# Patient Record
Sex: Female | Born: 1935 | Race: White | Hispanic: No | Marital: Married
Health system: Southern US, Community
[De-identification: ages and names within clinical notes are randomized; demographics above are authoritative.]

---

## 2005-12-01 ENCOUNTER — Ambulatory Visit: Payer: Self-pay | Admitting: Ophthalmology

## 2007-11-10 ENCOUNTER — Ambulatory Visit: Payer: Self-pay | Admitting: Family Medicine

## 2008-02-26 ENCOUNTER — Emergency Department: Payer: Self-pay | Admitting: Emergency Medicine

## 2008-03-02 ENCOUNTER — Emergency Department: Payer: Self-pay | Admitting: Emergency Medicine

## 2008-12-11 ENCOUNTER — Ambulatory Visit: Payer: Self-pay | Admitting: Family Medicine

## 2010-01-22 ENCOUNTER — Ambulatory Visit: Payer: Self-pay | Admitting: Internal Medicine

## 2011-06-23 ENCOUNTER — Ambulatory Visit: Payer: Self-pay | Admitting: Internal Medicine

## 2012-06-24 ENCOUNTER — Inpatient Hospital Stay: Payer: Self-pay | Admitting: Orthopedic Surgery

## 2012-06-24 LAB — URINALYSIS, COMPLETE
Bilirubin,UR: NEGATIVE
Glucose,UR: NEGATIVE mg/dL (ref 0–75)
Nitrite: NEGATIVE
Ph: 7 (ref 4.5–8.0)
Protein: NEGATIVE
RBC,UR: 1 /HPF (ref 0–5)

## 2012-06-24 LAB — CBC
HCT: 37 % (ref 35.0–47.0)
Platelet: 181 10*3/uL (ref 150–440)
RBC: 4.06 10*6/uL (ref 3.80–5.20)
RDW: 15.1 % — ABNORMAL HIGH (ref 11.5–14.5)
WBC: 8 10*3/uL (ref 3.6–11.0)

## 2012-06-24 LAB — BASIC METABOLIC PANEL
Anion Gap: 3 — ABNORMAL LOW (ref 7–16)
Calcium, Total: 8.3 mg/dL — ABNORMAL LOW (ref 8.5–10.1)
EGFR (African American): >60
Glucose: 168 mg/dL — ABNORMAL HIGH (ref 65–99)
Potassium: 3.7 mmol/L (ref 3.5–5.1)

## 2012-06-25 LAB — CBC WITH DIFFERENTIAL/PLATELET
Basophil #: 0 10*3/uL (ref 0.0–0.1)
HCT: 37.4 % (ref 35.0–47.0)
HGB: 12.2 g/dL (ref 12.0–16.0)
Lymphocyte #: 0.8 10*3/uL — ABNORMAL LOW (ref 1.0–3.6)
Lymphocyte %: 7.9 %
MCHC: 32.6 g/dL (ref 32.0–36.0)
MCV: 91 fL (ref 80–100)
Monocyte %: 10 %
Neutrophil %: 81.5 %
RDW: 15 % — ABNORMAL HIGH (ref 11.5–14.5)
WBC: 9.8 10*3/uL (ref 3.6–11.0)

## 2012-06-25 LAB — PROTIME-INR
INR: 1
Prothrombin Time: 13.1 secs (ref 11.5–14.7)

## 2012-06-25 LAB — BASIC METABOLIC PANEL
BUN: 16 mg/dL (ref 7–18)
Calcium, Total: 8.2 mg/dL — ABNORMAL LOW (ref 8.5–10.1)
Chloride: 104 mmol/L (ref 98–107)
Co2: 31 mmol/L (ref 21–32)
EGFR (African American): >60
EGFR (Non-African Amer.): >60
Glucose: 118 mg/dL — ABNORMAL HIGH (ref 65–99)
Osmolality: 278 (ref 275–301)
Potassium: 4.4 mmol/L (ref 3.5–5.1)

## 2012-06-26 LAB — CBC WITH DIFFERENTIAL/PLATELET
Eosinophil %: 0.1 %
HCT: 23.6 % — ABNORMAL LOW (ref 35.0–47.0)
HGB: 7.8 g/dL — ABNORMAL LOW (ref 12.0–16.0)
MCH: 30 pg (ref 26.0–34.0)
MCHC: 32.9 g/dL (ref 32.0–36.0)
MCV: 91 fL (ref 80–100)
Monocyte #: 1.5 x10 3/mm — ABNORMAL HIGH (ref 0.2–0.9)
Monocyte %: 16.5 %
Neutrophil #: 6.6 10*3/uL — ABNORMAL HIGH (ref 1.4–6.5)
Neutrophil %: 75.1 %
RBC: 2.59 10*6/uL — ABNORMAL LOW (ref 3.80–5.20)
RDW: 15.2 % — ABNORMAL HIGH (ref 11.5–14.5)
WBC: 8.8 10*3/uL (ref 3.6–11.0)

## 2012-06-26 LAB — PROTIME-INR: INR: 1.1

## 2012-06-28 ENCOUNTER — Encounter: Payer: Self-pay | Admitting: Internal Medicine

## 2012-06-28 LAB — CBC WITH DIFFERENTIAL/PLATELET
Basophil %: 0.3 %
Eosinophil #: 0.1 10*3/uL (ref 0.0–0.7)
Eosinophil %: 1.2 %
HCT: 23 % — ABNORMAL LOW (ref 35.0–47.0)
Lymphocyte #: 0.9 10*3/uL — ABNORMAL LOW (ref 1.0–3.6)
Lymphocyte %: 9.4 %
MCHC: 34.1 g/dL (ref 32.0–36.0)
Monocyte #: 1.7 x10 3/mm — ABNORMAL HIGH (ref 0.2–0.9)
Monocyte %: 16.5 %
Neutrophil %: 72.6 %
Platelet: 145 10*3/uL — ABNORMAL LOW (ref 150–440)
RDW: 15 % — ABNORMAL HIGH (ref 11.5–14.5)
WBC: 10 10*3/uL (ref 3.6–11.0)

## 2012-06-28 LAB — BASIC METABOLIC PANEL
Calcium, Total: 8.2 mg/dL — ABNORMAL LOW (ref 8.5–10.1)
Co2: 29 mmol/L (ref 21–32)
EGFR (Non-African Amer.): >60
Glucose: 97 mg/dL (ref 65–99)
Potassium: 3.9 mmol/L (ref 3.5–5.1)

## 2012-06-29 LAB — PATHOLOGY REPORT

## 2012-06-30 ENCOUNTER — Encounter: Payer: Self-pay | Admitting: Internal Medicine

## 2012-07-19 LAB — BASIC METABOLIC PANEL
BUN: 16 mg/dL (ref 7–18)
Calcium, Total: 8.5 mg/dL (ref 8.5–10.1)
Co2: 30 mmol/L (ref 21–32)
EGFR (African American): >60
EGFR (Non-African Amer.): >60
Glucose: 79 mg/dL (ref 65–99)
Osmolality: 283 (ref 275–301)
Potassium: 3.9 mmol/L (ref 3.5–5.1)
Sodium: 142 mmol/L (ref 136–145)

## 2012-07-19 LAB — T4, FREE: Free Thyroxine: 1.14 ng/dL (ref 0.76–1.46)

## 2012-07-19 LAB — TSH: Thyroid Stimulating Horm: 1.11 u[IU]/mL

## 2012-07-20 ENCOUNTER — Inpatient Hospital Stay: Payer: Self-pay | Admitting: Orthopedic Surgery

## 2012-07-20 LAB — CBC
MCHC: 33 g/dL (ref 32.0–36.0)
Platelet: 383 10*3/uL (ref 150–440)
RBC: 3.39 10*6/uL — ABNORMAL LOW (ref 3.80–5.20)
WBC: 6.7 10*3/uL (ref 3.6–11.0)

## 2012-07-20 LAB — TROPONIN I: Troponin-I: <0.02 ng/mL

## 2012-07-20 LAB — COMPREHENSIVE METABOLIC PANEL
Albumin: 2.9 g/dL — ABNORMAL LOW (ref 3.4–5.0)
BUN: 18 mg/dL (ref 7–18)
Bilirubin,Total: 0.4 mg/dL (ref 0.2–1.0)
Creatinine: 0.78 mg/dL (ref 0.60–1.30)
EGFR (African American): >60
Osmolality: 279 (ref 275–301)
Potassium: 3.6 mmol/L (ref 3.5–5.1)
SGOT(AST): 29 U/L (ref 15–37)
SGPT (ALT): 32 U/L (ref 12–78)
Total Protein: 7 g/dL (ref 6.4–8.2)

## 2012-07-20 LAB — URINALYSIS, COMPLETE
Bacteria: NONE SEEN
Blood: NEGATIVE
Glucose,UR: NEGATIVE mg/dL (ref 0–75)
Leukocyte Esterase: NEGATIVE
Ph: 6 (ref 4.5–8.0)
Protein: NEGATIVE
WBC UR: <1 /HPF (ref 0–5)

## 2012-07-20 LAB — PROTIME-INR
INR: 1
Prothrombin Time: 13.7 secs (ref 11.5–14.7)

## 2012-07-20 LAB — APTT: Activated PTT: 25.2 secs (ref 23.6–35.9)

## 2012-07-21 LAB — BASIC METABOLIC PANEL
BUN: 15 mg/dL (ref 7–18)
Calcium, Total: 8.3 mg/dL — ABNORMAL LOW (ref 8.5–10.1)
EGFR (African American): >60
Glucose: 101 mg/dL — ABNORMAL HIGH (ref 65–99)
Osmolality: 279 (ref 275–301)
Potassium: 4 mmol/L (ref 3.5–5.1)
Sodium: 139 mmol/L (ref 136–145)

## 2012-07-21 LAB — CBC WITH DIFFERENTIAL/PLATELET
Basophil %: 0.8 %
HCT: 25.5 % — ABNORMAL LOW (ref 35.0–47.0)
HGB: 8.4 g/dL — ABNORMAL LOW (ref 12.0–16.0)
Lymphocyte %: 12.6 %
MCHC: 33 g/dL (ref 32.0–36.0)
MCV: 92 fL (ref 80–100)
Monocyte #: 1.2 x10 3/mm — ABNORMAL HIGH (ref 0.2–0.9)
Monocyte %: 16.2 %
Neutrophil %: 68.8 %
RBC: 2.79 10*6/uL — ABNORMAL LOW (ref 3.80–5.20)
RDW: 16 % — ABNORMAL HIGH (ref 11.5–14.5)

## 2012-07-22 LAB — CBC WITH DIFFERENTIAL/PLATELET
Basophil #: 0 10*3/uL (ref 0.0–0.1)
Eosinophil %: 0.3 %
HCT: 23.7 % — ABNORMAL LOW (ref 35.0–47.0)
HGB: 8 g/dL — ABNORMAL LOW (ref 12.0–16.0)
Lymphocyte %: 11.9 %
MCH: 29.7 pg (ref 26.0–34.0)
Monocyte %: 22 %
Neutrophil #: 5 10*3/uL (ref 1.4–6.5)
Neutrophil %: 65.4 %
Platelet: 191 10*3/uL (ref 150–440)
RBC: 2.68 10*6/uL — ABNORMAL LOW (ref 3.80–5.20)

## 2012-07-22 LAB — BASIC METABOLIC PANEL
BUN: 16 mg/dL (ref 7–18)
Chloride: 107 mmol/L (ref 98–107)
Co2: 29 mmol/L (ref 21–32)
Creatinine: 0.57 mg/dL — ABNORMAL LOW (ref 0.60–1.30)
EGFR (African American): >60
Glucose: 112 mg/dL — ABNORMAL HIGH (ref 65–99)
Osmolality: 279 (ref 275–301)
Potassium: 3.9 mmol/L (ref 3.5–5.1)
Sodium: 139 mmol/L (ref 136–145)

## 2012-07-22 LAB — HEMOGLOBIN: HGB: 7.7 g/dL — ABNORMAL LOW (ref 12.0–16.0)

## 2012-07-23 LAB — BASIC METABOLIC PANEL
Anion Gap: 4 — ABNORMAL LOW (ref 7–16)
Calcium, Total: 7.7 mg/dL — ABNORMAL LOW (ref 8.5–10.1)
Chloride: 105 mmol/L (ref 98–107)
Co2: 29 mmol/L (ref 21–32)
EGFR (African American): >60
Sodium: 138 mmol/L (ref 136–145)

## 2012-07-24 LAB — CBC WITH DIFFERENTIAL/PLATELET
Basophil #: 0 10*3/uL (ref 0.0–0.1)
Basophil %: 0.2 %
HCT: 20.4 % — ABNORMAL LOW (ref 35.0–47.0)
Lymphocyte #: 0.9 10*3/uL — ABNORMAL LOW (ref 1.0–3.6)
Lymphocyte %: 7.3 %
MCH: 29.6 pg (ref 26.0–34.0)
MCHC: 33.3 g/dL (ref 32.0–36.0)
MCV: 89 fL (ref 80–100)
Monocyte #: 1.9 x10 3/mm — ABNORMAL HIGH (ref 0.2–0.9)
RBC: 2.29 10*6/uL — ABNORMAL LOW (ref 3.80–5.20)
RDW: 15.2 % — ABNORMAL HIGH (ref 11.5–14.5)
WBC: 11.6 10*3/uL — ABNORMAL HIGH (ref 3.6–11.0)

## 2012-07-24 LAB — HEMOGLOBIN: HGB: 9.4 g/dL — ABNORMAL LOW (ref 12.0–16.0)

## 2012-07-24 LAB — BASIC METABOLIC PANEL
Anion Gap: 7 (ref 7–16)
Calcium, Total: 7.7 mg/dL — ABNORMAL LOW (ref 8.5–10.1)
Creatinine: 0.63 mg/dL (ref 0.60–1.30)
EGFR (African American): >60
EGFR (Non-African Amer.): >60
Glucose: 106 mg/dL — ABNORMAL HIGH (ref 65–99)
Potassium: 3.6 mmol/L (ref 3.5–5.1)
Sodium: 138 mmol/L (ref 136–145)

## 2012-07-25 LAB — CBC WITH DIFFERENTIAL/PLATELET
Basophil #: 0.1 10*3/uL (ref 0.0–0.1)
Basophil %: 0.7 %
Eosinophil %: 1.9 %
HCT: 27.5 % — ABNORMAL LOW (ref 35.0–47.0)
HGB: 9.3 g/dL — ABNORMAL LOW (ref 12.0–16.0)
Lymphocyte #: 1 10*3/uL (ref 1.0–3.6)
Lymphocyte %: 7.7 %
MCHC: 33.9 g/dL (ref 32.0–36.0)
MCV: 88 fL (ref 80–100)
Monocyte #: 1.7 x10 3/mm — ABNORMAL HIGH (ref 0.2–0.9)
RBC: 3.12 10*6/uL — ABNORMAL LOW (ref 3.80–5.20)
RDW: 14.8 % — ABNORMAL HIGH (ref 11.5–14.5)

## 2012-07-25 LAB — BASIC METABOLIC PANEL
BUN: 13 mg/dL (ref 7–18)
Chloride: 104 mmol/L (ref 98–107)
Co2: 28 mmol/L (ref 21–32)
Creatinine: 0.51 mg/dL — ABNORMAL LOW (ref 0.60–1.30)
EGFR (African American): >60
EGFR (Non-African Amer.): >60
Glucose: 99 mg/dL (ref 65–99)
Osmolality: 276 (ref 275–301)
Potassium: 3.7 mmol/L (ref 3.5–5.1)

## 2012-07-26 LAB — CBC WITH DIFFERENTIAL/PLATELET
Basophil %: 0.2 %
Eosinophil #: 0.3 10*3/uL (ref 0.0–0.7)
Eosinophil %: 2.2 %
MCH: 29.7 pg (ref 26.0–34.0)
MCV: 88 fL (ref 80–100)
Platelet: 232 10*3/uL (ref 150–440)
RBC: 3.12 10*6/uL — ABNORMAL LOW (ref 3.80–5.20)
RDW: 14.6 % — ABNORMAL HIGH (ref 11.5–14.5)
WBC: 11.6 10*3/uL — ABNORMAL HIGH (ref 3.6–11.0)

## 2012-07-31 ENCOUNTER — Encounter: Payer: Self-pay | Admitting: Internal Medicine

## 2012-07-31 ENCOUNTER — Ambulatory Visit: Payer: Self-pay | Admitting: Nurse Practitioner

## 2012-08-01 LAB — BASIC METABOLIC PANEL
Anion Gap: 6 — ABNORMAL LOW (ref 7–16)
BUN: 12 mg/dL (ref 7–18)
Chloride: 103 mmol/L (ref 98–107)
Co2: 30 mmol/L (ref 21–32)
Creatinine: 0.68 mg/dL (ref 0.60–1.30)
EGFR (Non-African Amer.): >60
Glucose: 103 mg/dL — ABNORMAL HIGH (ref 65–99)
Osmolality: 278 (ref 275–301)
Potassium: 4.2 mmol/L (ref 3.5–5.1)
Sodium: 139 mmol/L (ref 136–145)

## 2012-08-01 LAB — CBC WITH DIFFERENTIAL/PLATELET
Bands: 3 %
HCT: 31.5 % — ABNORMAL LOW (ref 35.0–47.0)
HGB: 10.2 g/dL — ABNORMAL LOW (ref 12.0–16.0)
Lymphocytes: 5 %
MCH: 28.7 pg (ref 26.0–34.0)
MCHC: 32.4 g/dL (ref 32.0–36.0)
MCV: 89 fL (ref 80–100)
Monocytes: 6 %
Myelocyte: 1 %
Platelet: 564 10*3/uL — ABNORMAL HIGH (ref 150–440)
RDW: 15 % — ABNORMAL HIGH (ref 11.5–14.5)
Segmented Neutrophils: 80 %

## 2012-08-02 LAB — URINALYSIS, COMPLETE
Bilirubin,UR: NEGATIVE
Glucose,UR: NEGATIVE mg/dL (ref 0–75)
Nitrite: NEGATIVE
Protein: NEGATIVE
Specific Gravity: 1.021 (ref 1.003–1.030)
WBC UR: 6 /HPF (ref 0–5)

## 2012-08-16 LAB — COMPREHENSIVE METABOLIC PANEL
Alkaline Phosphatase: 95 U/L (ref 50–136)
Anion Gap: 6 — ABNORMAL LOW (ref 7–16)
BUN: 13 mg/dL (ref 7–18)
Bilirubin,Total: 0.3 mg/dL (ref 0.2–1.0)
Calcium, Total: 8.5 mg/dL (ref 8.5–10.1)
Chloride: 101 mmol/L (ref 98–107)
Co2: 30 mmol/L (ref 21–32)
Creatinine: 0.56 mg/dL — ABNORMAL LOW (ref 0.60–1.30)
EGFR (African American): >60
Glucose: 92 mg/dL (ref 65–99)
Osmolality: 274 (ref 275–301)
Potassium: 4.1 mmol/L (ref 3.5–5.1)
SGOT(AST): 24 U/L (ref 15–37)
SGPT (ALT): 20 U/L (ref 12–78)
Sodium: 137 mmol/L (ref 136–145)
Total Protein: 6.4 g/dL (ref 6.4–8.2)

## 2012-08-16 LAB — CBC WITH DIFFERENTIAL/PLATELET
Basophil #: 0.1 10*3/uL (ref 0.0–0.1)
Basophil %: 0.8 %
Eosinophil %: 3.8 %
HGB: 9.4 g/dL — ABNORMAL LOW (ref 12.0–16.0)
Lymphocyte #: 1.3 10*3/uL (ref 1.0–3.6)
Lymphocyte %: 19 %
MCH: 28.2 pg (ref 26.0–34.0)
Monocyte #: 0.9 x10 3/mm (ref 0.2–0.9)
Monocyte %: 13.4 %
Neutrophil %: 63 %
RBC: 3.34 10*6/uL — ABNORMAL LOW (ref 3.80–5.20)

## 2012-08-16 LAB — PROTIME-INR: Prothrombin Time: 14.8 secs — ABNORMAL HIGH (ref 11.5–14.7)

## 2012-08-20 LAB — WOUND CULTURE

## 2012-08-22 LAB — VANCOMYCIN, TROUGH: Vancomycin, Trough: 6 ug/mL — ABNORMAL LOW (ref 10–20)

## 2012-08-22 LAB — CREATININE, SERUM: EGFR (African American): 57 — ABNORMAL LOW

## 2012-08-23 LAB — CBC WITH DIFFERENTIAL/PLATELET
Basophil %: 0.9 %
Eosinophil #: 0.4 10*3/uL (ref 0.0–0.7)
HCT: 28.1 % — ABNORMAL LOW (ref 35.0–47.0)
Lymphocyte #: 1.3 10*3/uL (ref 1.0–3.6)
MCHC: 33.4 g/dL (ref 32.0–36.0)
Monocyte #: 1.1 x10 3/mm — ABNORMAL HIGH (ref 0.2–0.9)
Neutrophil %: 62.4 %
Platelet: 353 10*3/uL (ref 150–440)
RBC: 3.32 10*6/uL — ABNORMAL LOW (ref 3.80–5.20)
WBC: 7.7 10*3/uL (ref 3.6–11.0)

## 2012-08-23 LAB — COMPREHENSIVE METABOLIC PANEL
Albumin: 2.1 g/dL — ABNORMAL LOW (ref 3.4–5.0)
Alkaline Phosphatase: 100 U/L (ref 50–136)
Anion Gap: 5 — ABNORMAL LOW (ref 7–16)
BUN: 15 mg/dL (ref 7–18)
Bilirubin,Total: 0.3 mg/dL (ref 0.2–1.0)
Chloride: 102 mmol/L (ref 98–107)
Co2: 32 mmol/L (ref 21–32)
EGFR (Non-African Amer.): >60
Glucose: 80 mg/dL (ref 65–99)
Osmolality: 277 (ref 275–301)
Potassium: 3.7 mmol/L (ref 3.5–5.1)
SGOT(AST): 23 U/L (ref 15–37)

## 2012-08-23 LAB — SEDIMENTATION RATE: Erythrocyte Sed Rate: 54 mm/hr — ABNORMAL HIGH (ref 0–30)

## 2012-08-24 ENCOUNTER — Ambulatory Visit: Payer: Self-pay | Admitting: Gerontology

## 2012-08-27 LAB — VANCOMYCIN, TROUGH: Vancomycin, Trough: 21 ug/mL (ref 10–20)

## 2012-08-30 ENCOUNTER — Encounter: Payer: Self-pay | Admitting: Internal Medicine

## 2012-08-31 ENCOUNTER — Emergency Department: Payer: Self-pay | Admitting: Emergency Medicine

## 2012-09-30 DEATH — deceased

## 2013-12-14 IMAGING — CT CT CERVICAL SPINE WITHOUT CONTRAST
1 series · 12 of 14 positions shown, 15 images · non-contrast
Comparison: None

REASON FOR EXAM: FALL, DEMENTIA
COMMENTS:

PROCEDURE:     CT  - CT CERVICAL SPINE WO  - June 24, 2012  [DATE]
RESULT:     Clinical Indication: Trauma
TECHNIQUE: Multiple axial CT images from the skull base to the mid vertebral
body of T1. obtained with sagittal and coronal reformatted images provided.

[Series 5: axial · axial · 0.31mm/px · z∈[+192,+355]mm · 12 of 101 slices shown, 15 images]
[im 8/101  soft-tissue]
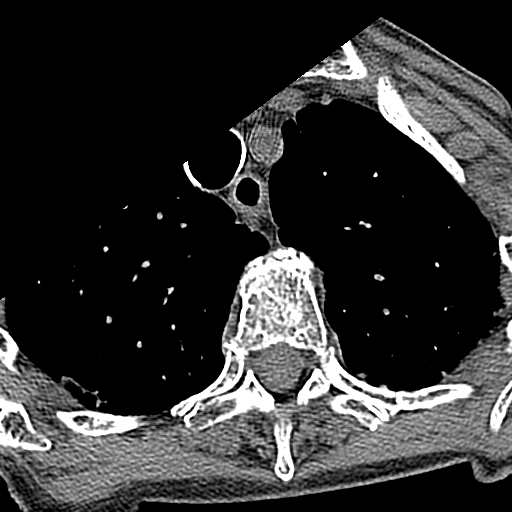
[im 8/101  bone]
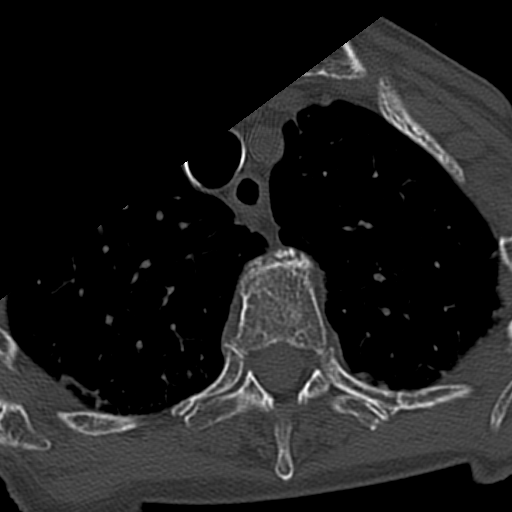
[im 16/101  bone]
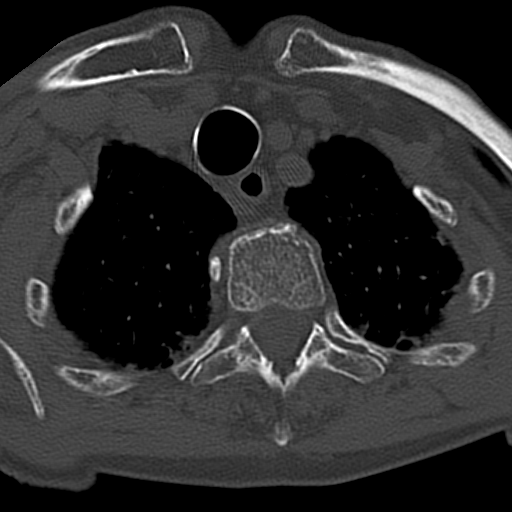
[im 24/101  bone]
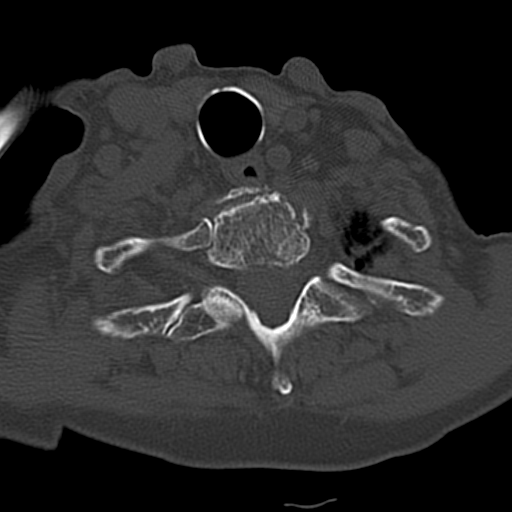
[im 31/101  bone]
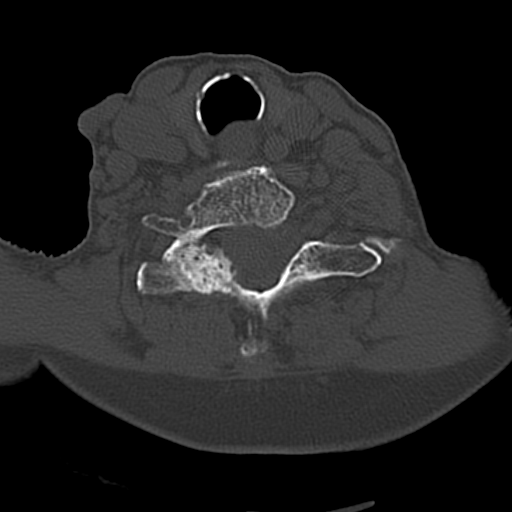
[im 39/101  soft-tissue]
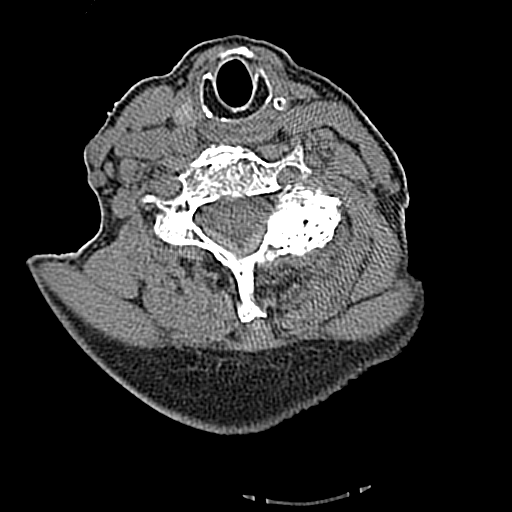
[im 39/101  bone]
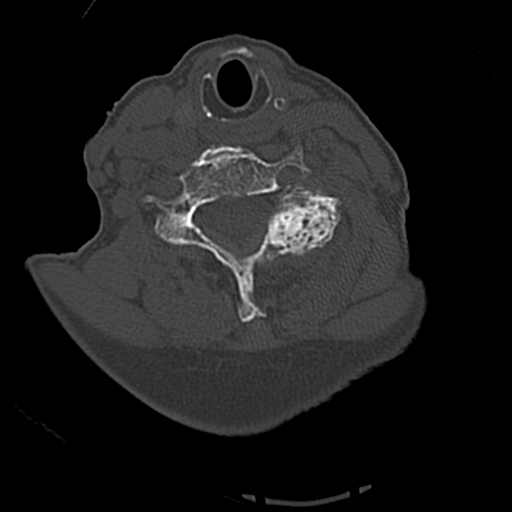
[im 47/101  bone]
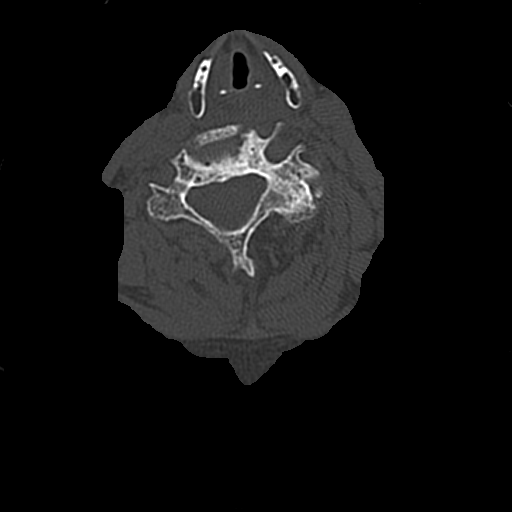
[im 54/101  bone]
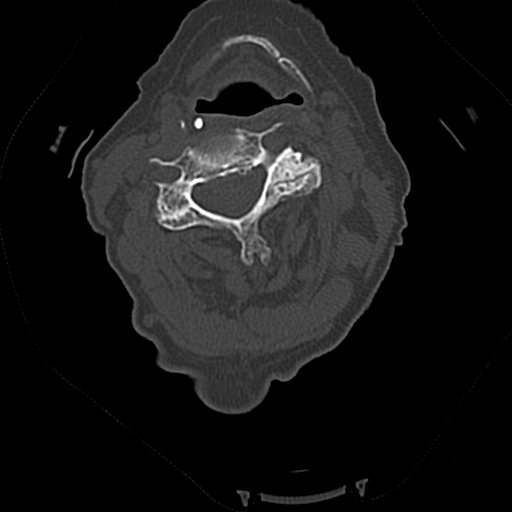
[im 62/101  bone]
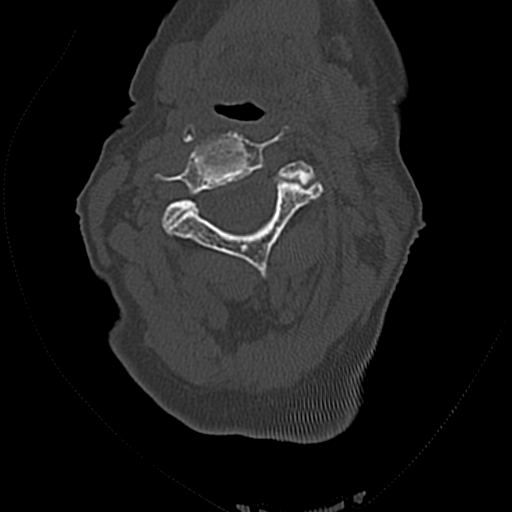
[im 70/101  soft-tissue]
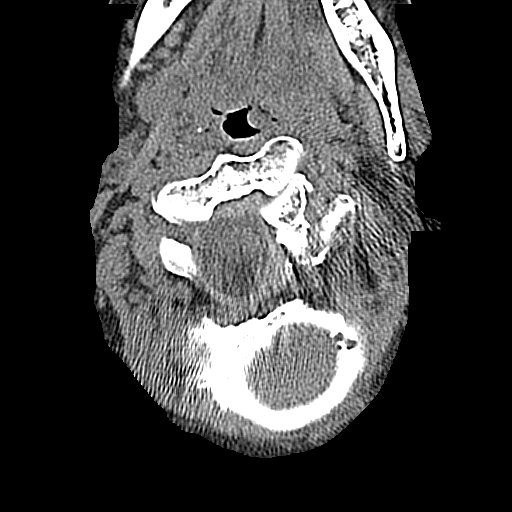
[im 70/101  bone]
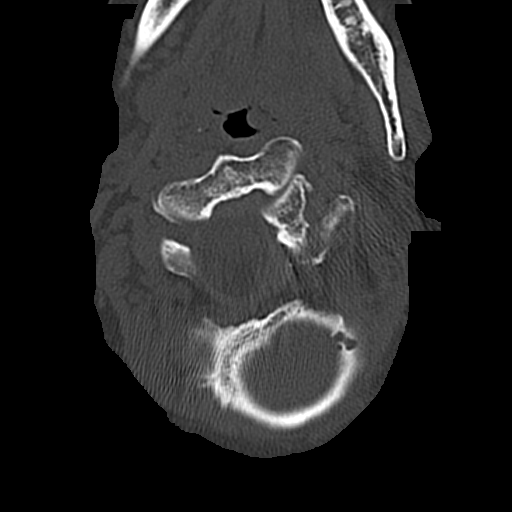
[im 77/101  bone]
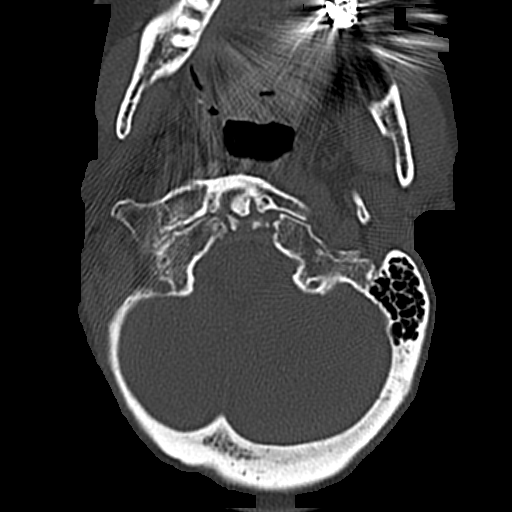
[im 85/101  bone]
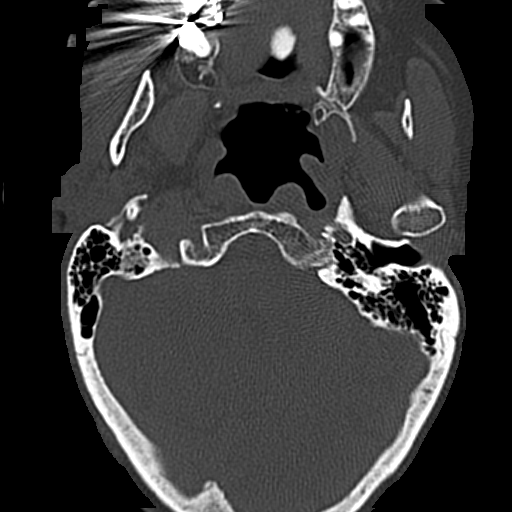
[im 93/101  bone]
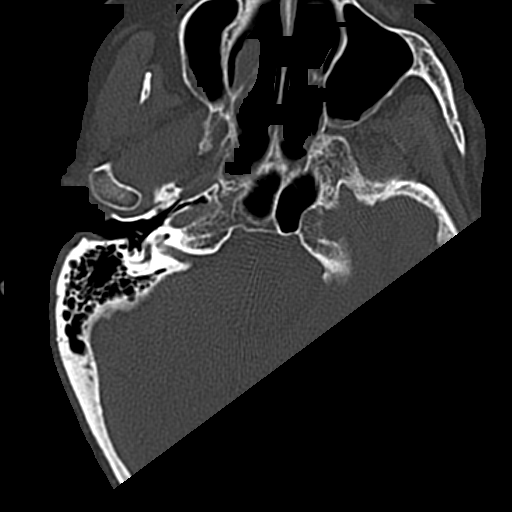

[12 of 14 positions shown; findings below may reference images not displayed]

FINDINGS: The alignment is anatomic. The vertebral body heights are maintained. There
is no acute fracture or static listhesis. The prevertebral soft tissues are
normal. The intraspinal soft tissues are not fully imaged on this
examination due to poor soft tissue contrast, but there is no soft tissue
gross abnormality.

There is degenerative disc disease throughout the cervical spine most severe
at C3-C4, C4-C5 and C5-C6 with discogenic endplate osteophytes. There is
severe facet arthropathy throughout the cervical spine.

The visualized portions of the lung apices demonstrate no focal abnormality.
IMPRESSION: 1. No acute osseous injury of the cervical spine.

2. Ligamentous injury is not evaluated. If there is high clinical concern
for ligamentous injury, consider MRI or flexion/extension radiographs as
clinically indicated and tolerated.

[REDACTED]

## 2014-01-10 IMAGING — XA DG C-ARM 1-60 MIN
1 series · 5 of 5 positions shown · non-contrast
Comparison: none

REASON FOR EXAM: Fracture reduction/fixation
COMMENTS:

PROCEDURE:     DXR - DXR C-ARM WITH 2 VIEWS RT HIP  - July 21, 2012  [DATE]
RESULT:     Comparison: 07/20/2012

[Series 6001: (person_name) · 5 of 5 slices shown]
[im 1/5]
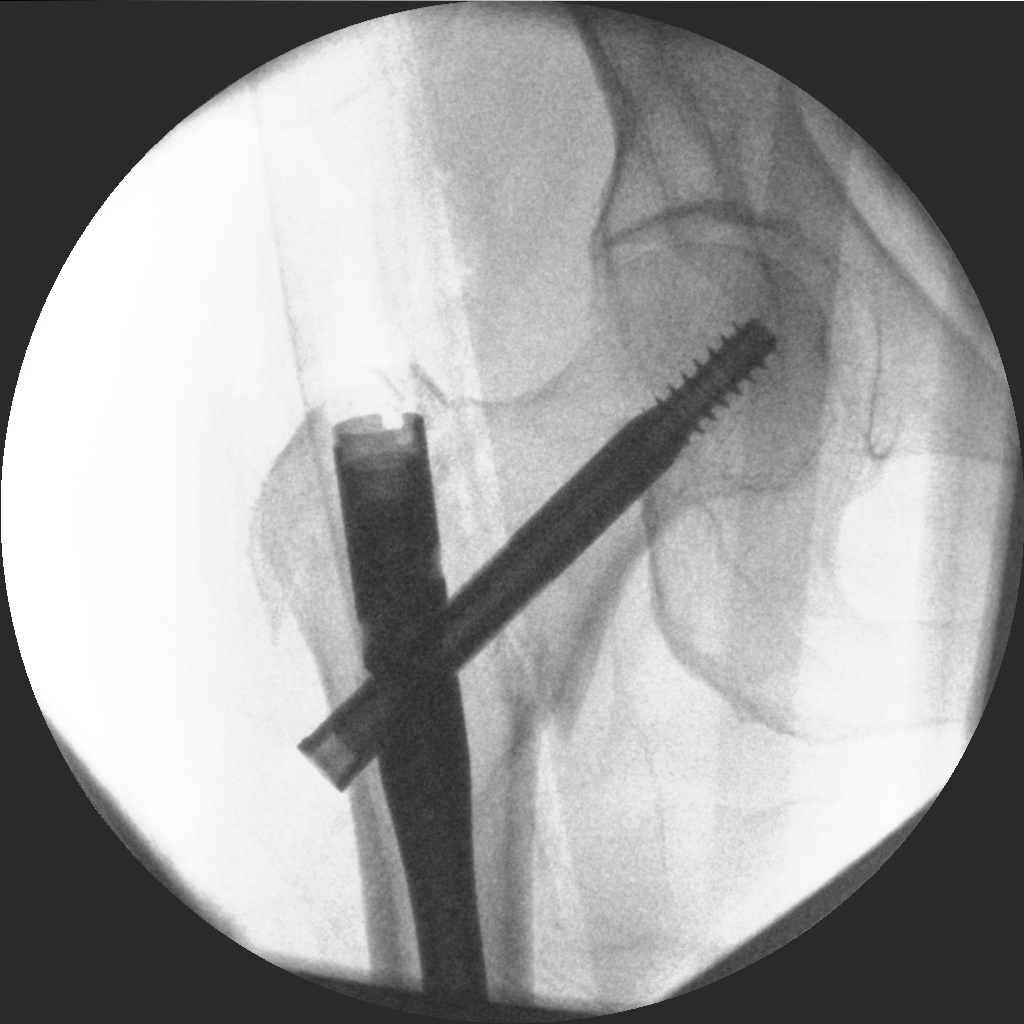
[im 2/5]
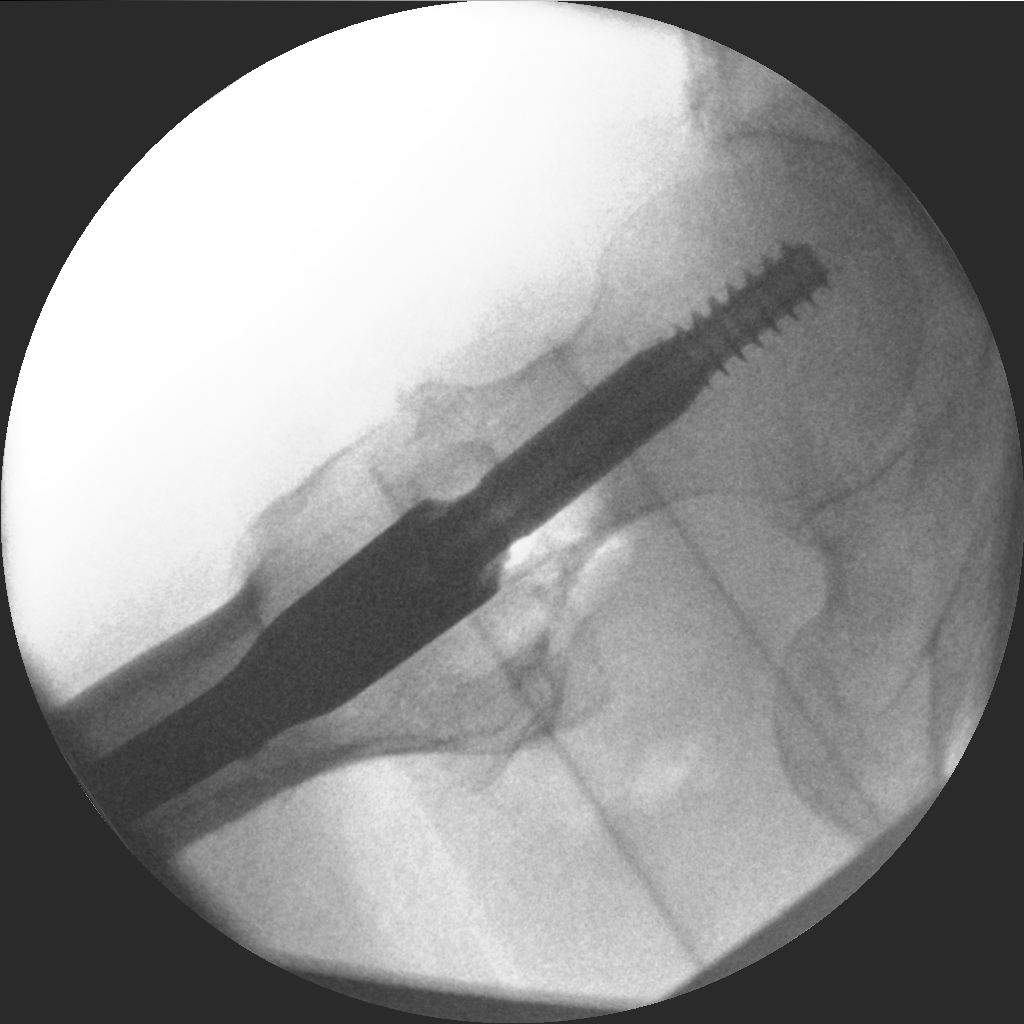
[im 3/5]
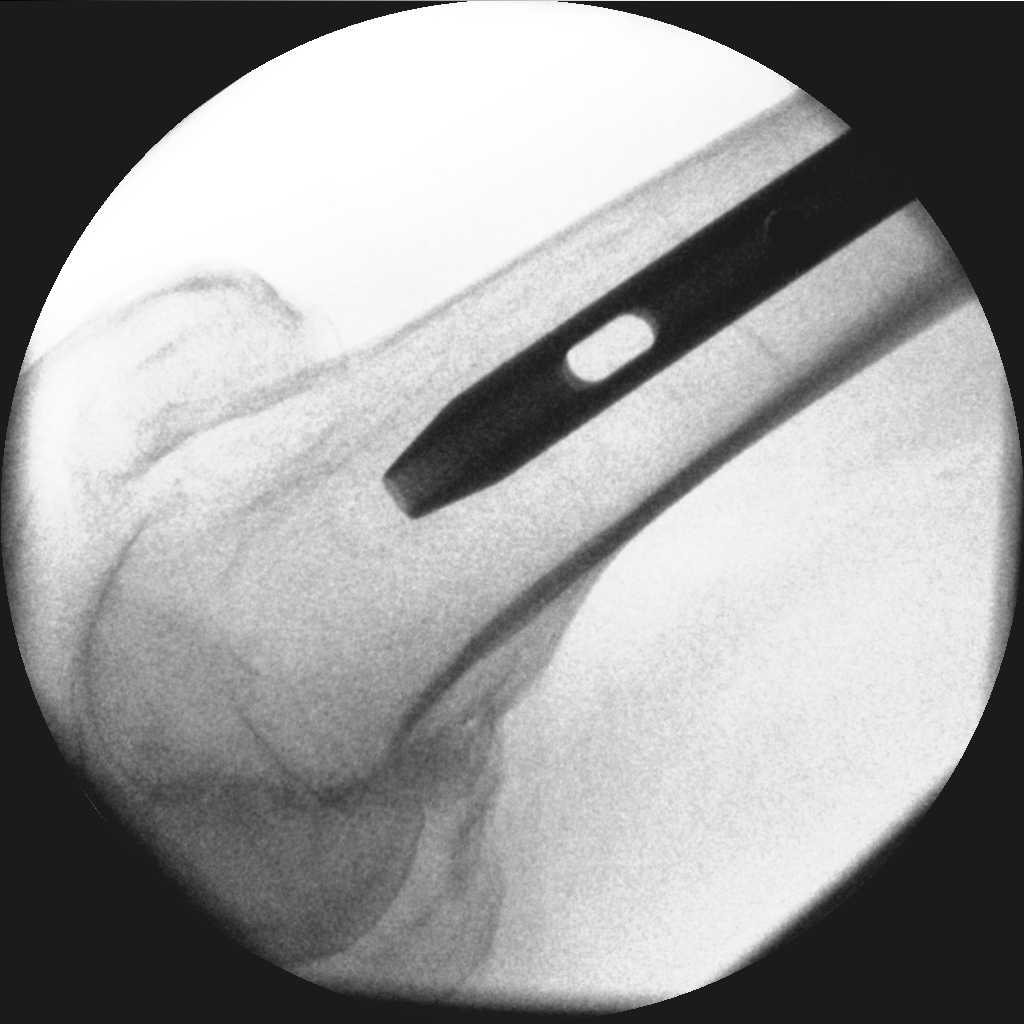
[im 4/5]
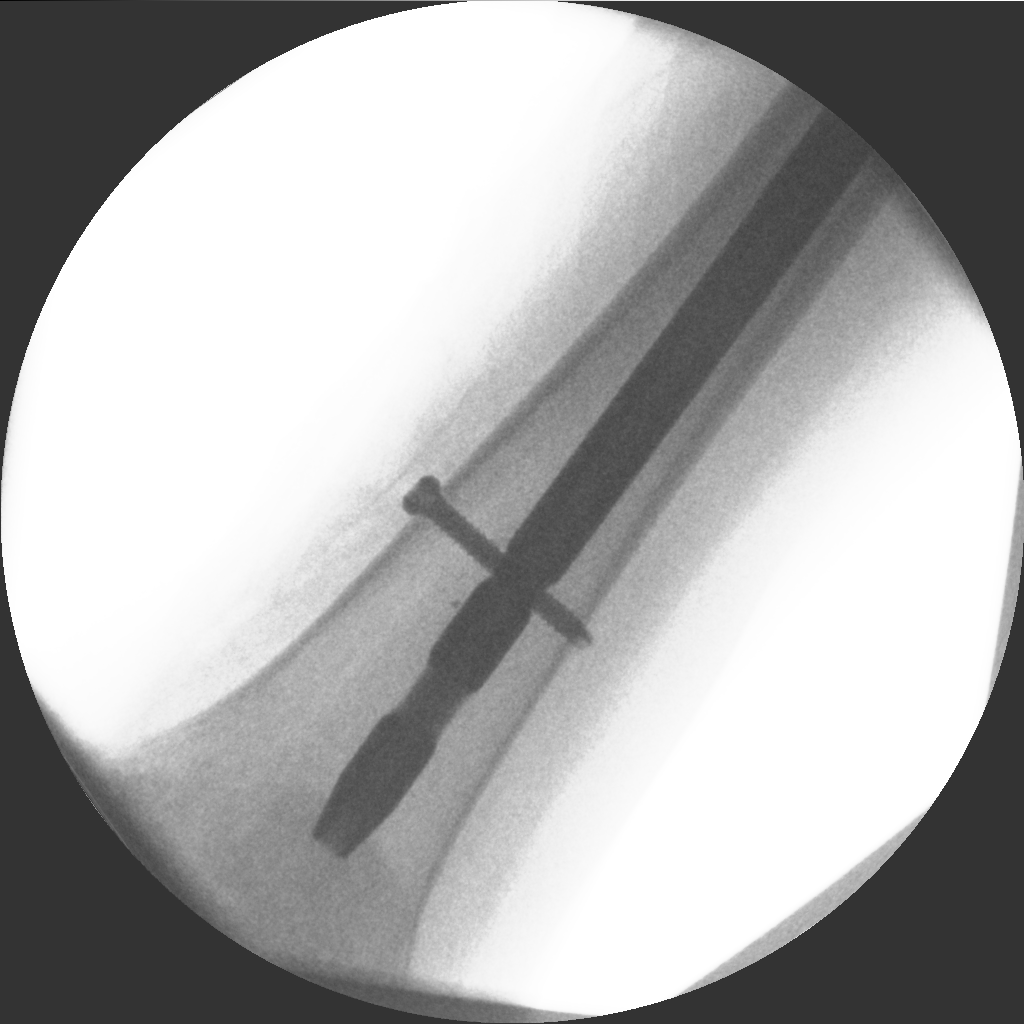
[im 5/5]
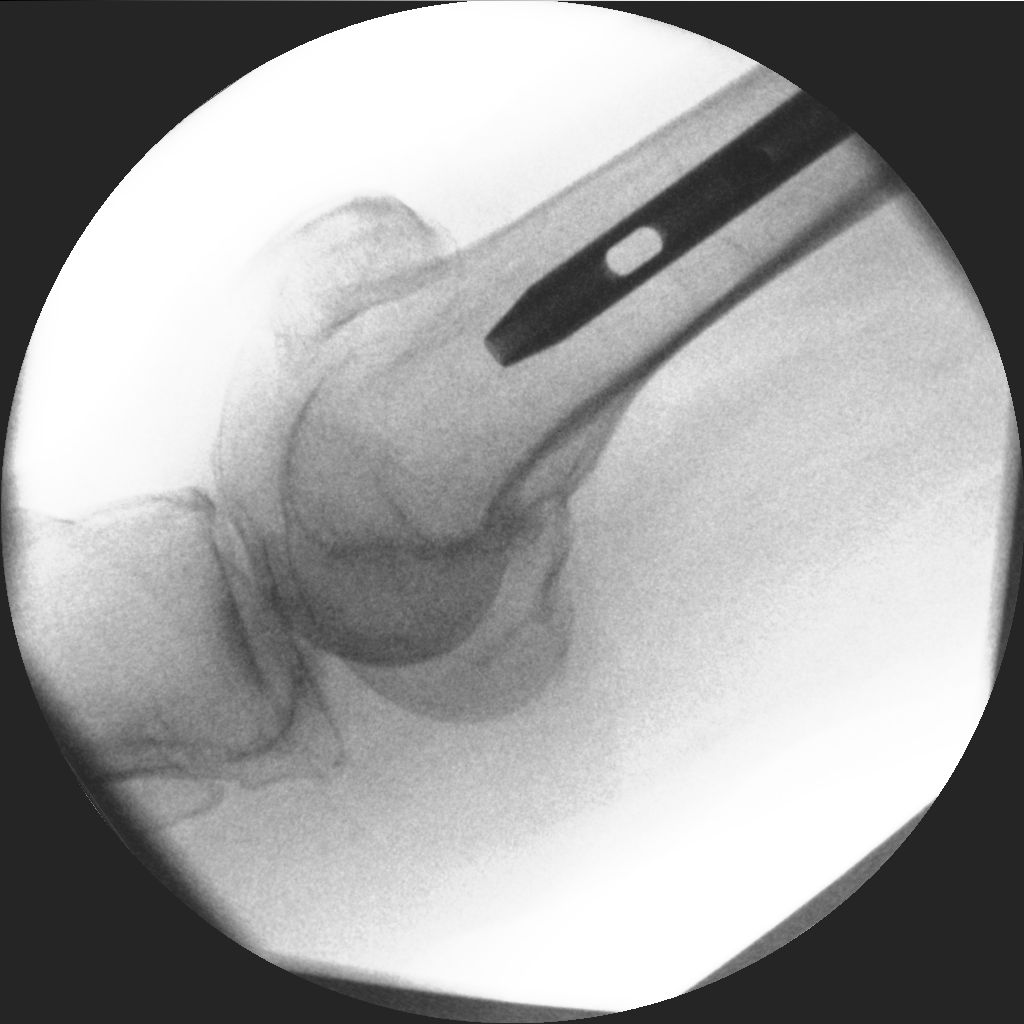

[5 of 5 positions shown; findings below may reference images not displayed]

FINDINGS: Five fluoroscopic images were submitted from intraoperative procedure.
Intramedullary rod and interlocking screws are seen traversing the femur.
Intertrochanteric fracture is again demonstrated. The remainder of the
interpretation is deferred to the performing clinician.
IMPRESSION: Please see above.

[REDACTED]

## 2014-02-20 IMAGING — CR DG CHEST 1V PORT
1 series · 1 of 1 positions shown · non-contrast
Comparison: none

REASON FOR EXAM: post PICC line replacement
COMMENTS:

[ap]
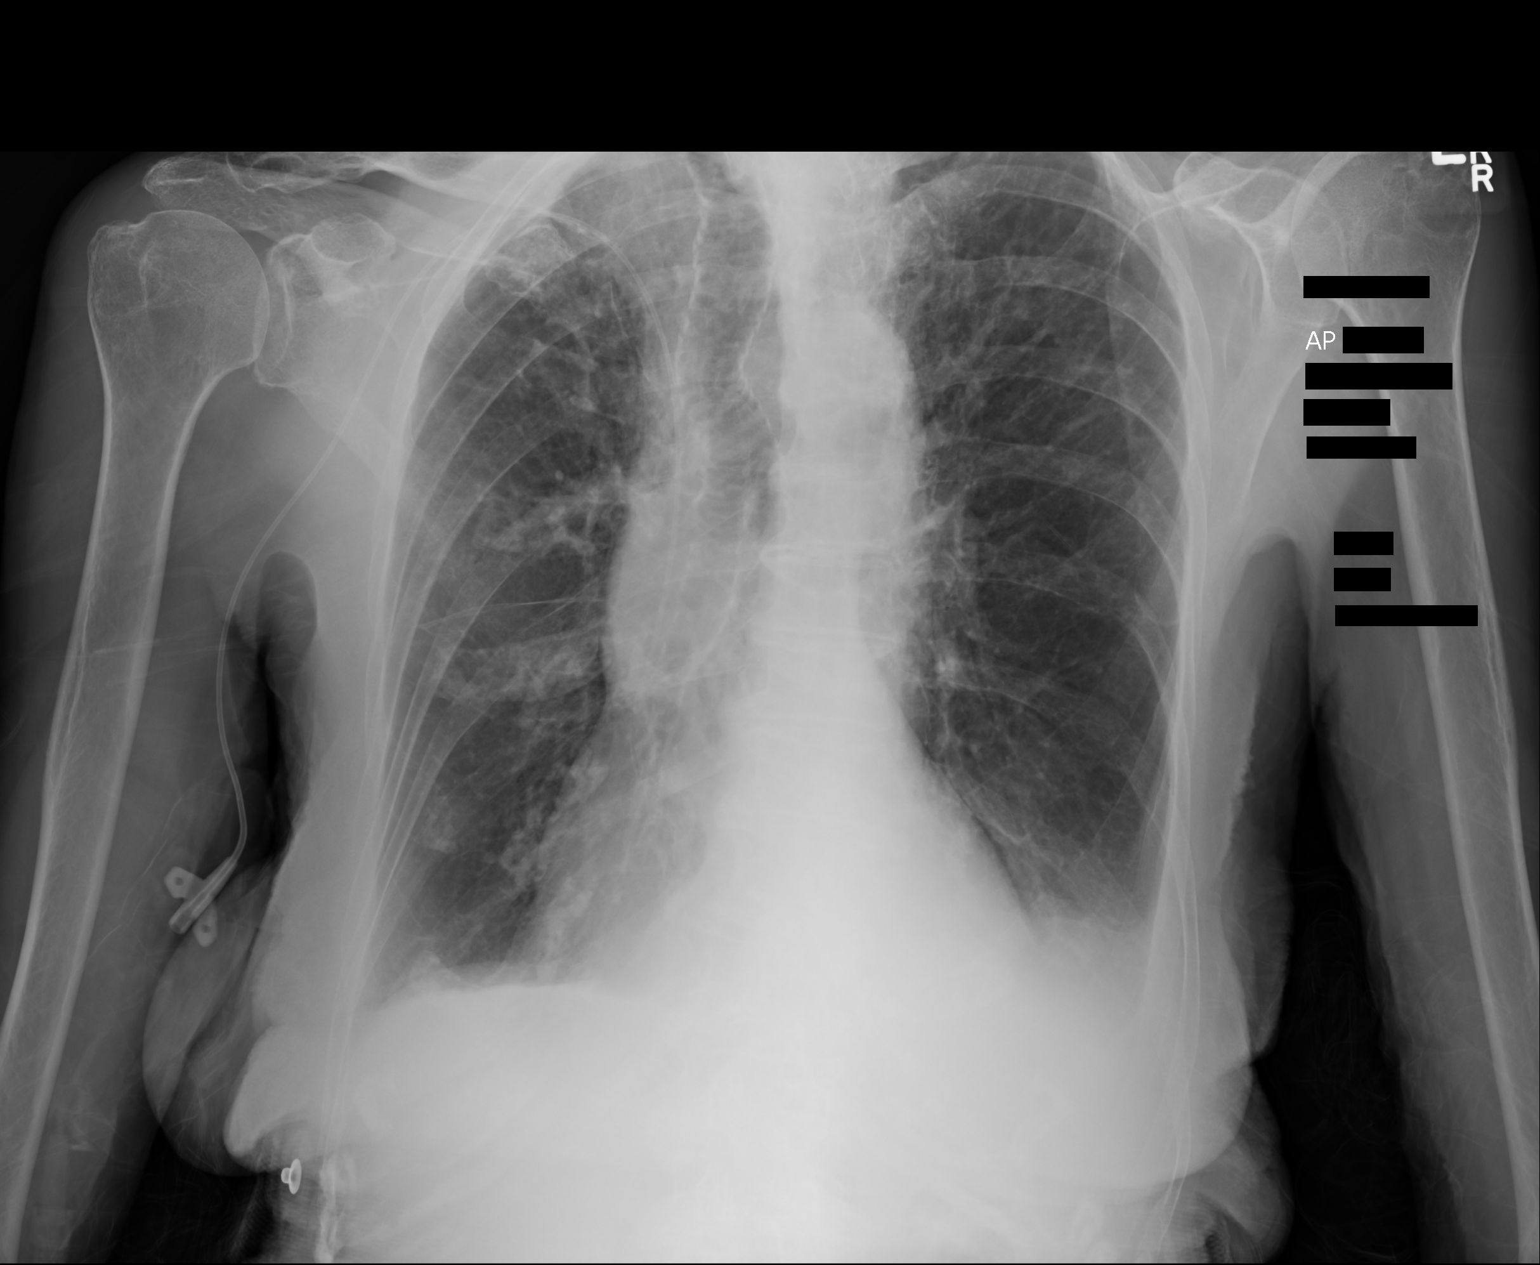

[1 of 1 positions shown; findings below may reference images not displayed]

PROCEDURE:     DXR - DXR PORTABLE CHEST SINGLE VIEW  - August 31, 2012 [DATE]

RESULT:     Comparison made to prior study 08/31/2012. Central line noted in
good anatomic position. Right hilar fullness is present. Mass lesion cannot
be excluded. There is atelectasis in the right upper lobe. Small left
pleural effusion is present.

Cardiovascular structures are unremarkable.
IMPRESSION: Possible right hilar mass with atelectasis right upper
lobe. Chest CTs can be obtained for further evaluation. Small left pleural
effusion . PICC line in good position.

## 2014-02-20 IMAGING — CR DG CHEST 1V PORT
1 series · 1 of 1 positions shown · non-contrast
Comparison: none

REASON FOR EXAM: PICC line possibly broken off
COMMENTS:

[ap]
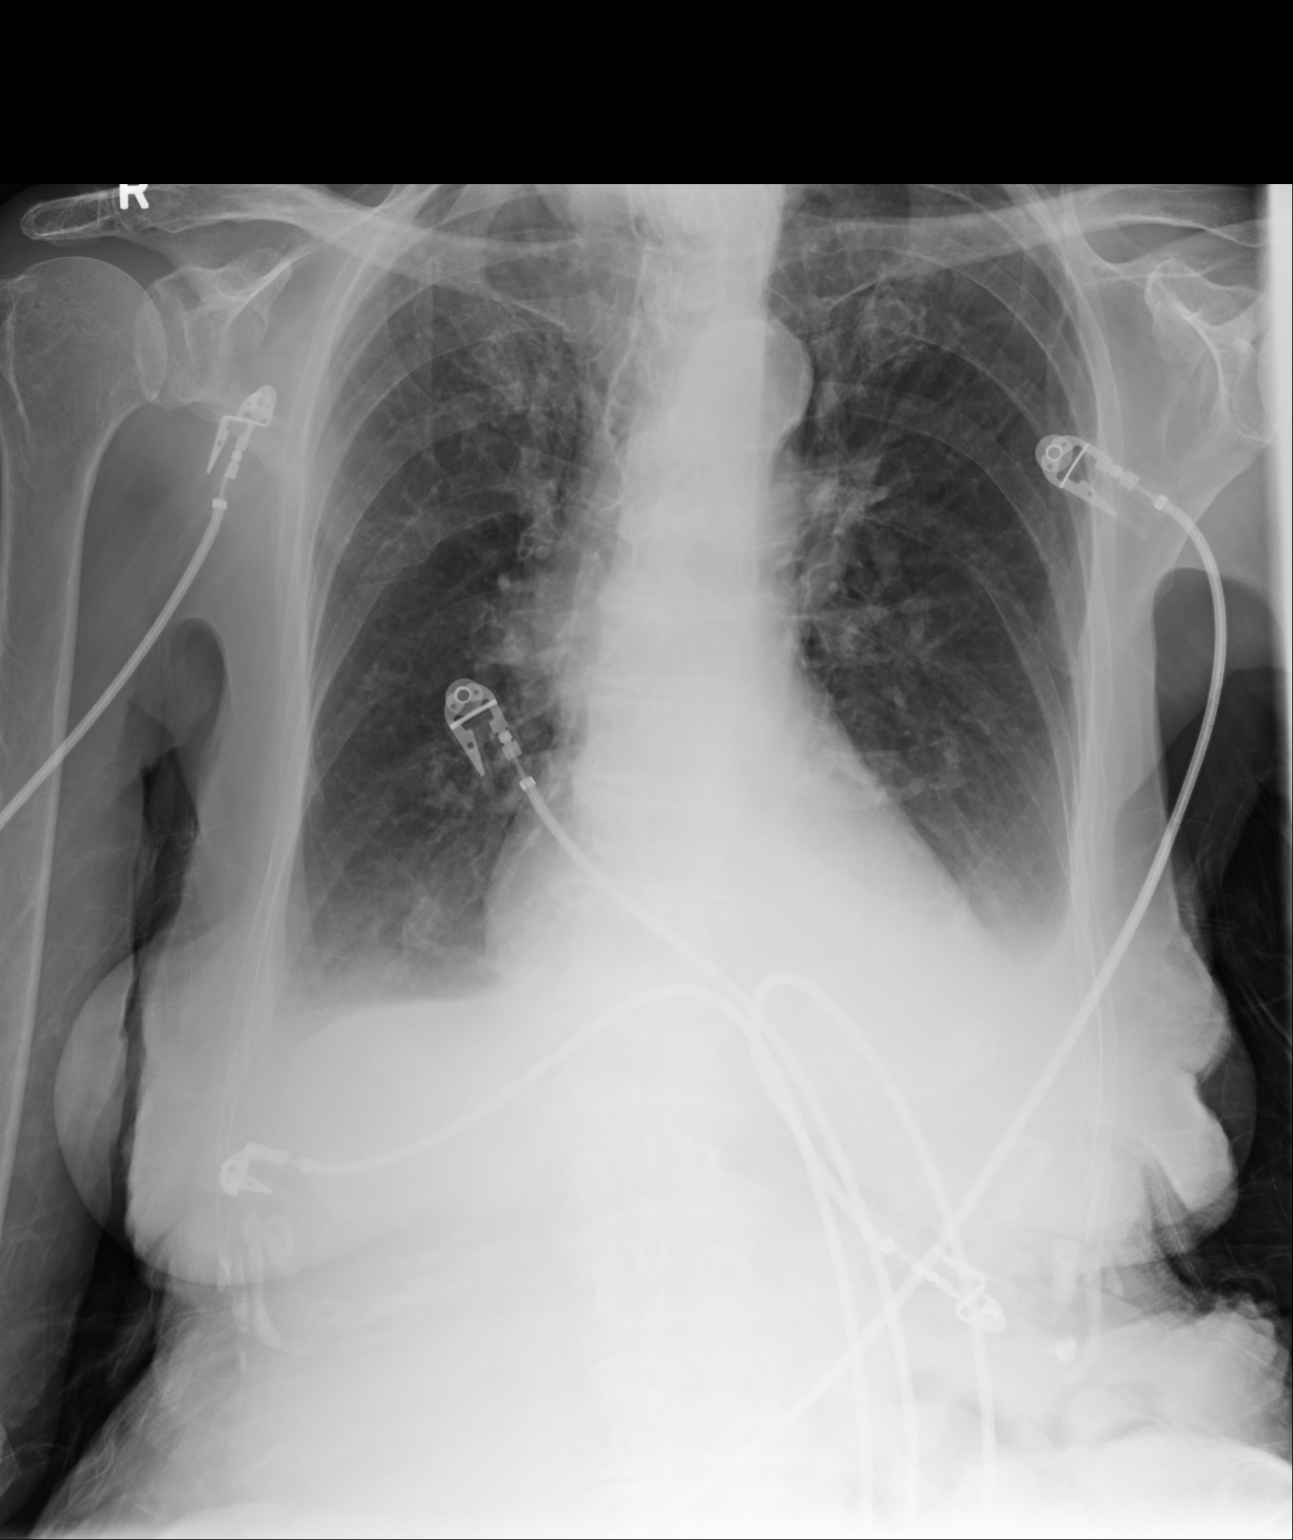

[1 of 1 positions shown; findings below may reference images not displayed]

PROCEDURE:     DXR - DXR PORTABLE CHEST SINGLE VIEW  - August 31, 2012  [DATE]

RESULT:     Comparison made to prior study of 5 21 0588. Cardiac enlargement
with pulmonary vascular prominence and interstitial prominence with
bilateral pleural effusions  noted. These findings are new and consistent
with congestive heart failure. No focal infiltrate. No PICC line fragment
noted.
IMPRESSION: Findings consistent with congestive heart failure. No PICC
line fragment noted.

## 2014-06-22 NOTE — Consult Note (Signed)
PATIENT NAME:  [name], [name] MR#:  [id number] DATE OF BIRTH:  [birth year]  DATE OF ADMISSION:  06/24/2012  DATE OF CONSULTATION:  06/24/2012  REFERRING PHYSICIAN:   Dr. Murlean HarkShalini Ramasunder.   CONSULTING PHYSICIAN:  Krystal EatonShayiq Kaleb Linquist, MD  REASON FOR CONSULTATION:  Preop evaluation.   HISTORY OF PRESENT ILLNESS:  The patient is a pleasant 81 year old Caucasian female with a history of what appears to be dementia with hallucinations, asthma, osteoporosis and chronic lower extremity cellulitis and varicose veins. She was admitted to orthopedic service and medicine was consulted for preop evaluation. The patient was in her usual state of self and had a fall earlier today. The patient remembers the fall and feels like she tripped. The patient has a son in the room. The patient is a poor historian given his advanced dementia, and the son stated that over the last month or so, she has had multiple falls. She did not complain of any chest pains or syncope. The patient fell on the left side sustaining a left wrist and hip fracture, as well as significant left periorbital and left forehead hematoma. She currently states her pain is controlled, but has bouts of pain in the left hip. She has had a CT of the head, which shows changes of atrophy with chronic microvascular ischemic disease without acute intracranial abnormality with  large left periorbital and frontal scalp hematoma. She denies having any pains in the chest. Per son, they are living in a one-story house, and she lives with her husband, who is 79 years old. She is ambulatory around the house without any complaints of shortness of breath or chest pain.   PAST MEDICAL HISTORY:  Dementia with hallucinations, osteoporosis, asthma, chronic left lower extremity cellulitis, off and on Keflex since January, and varicose veins.   PAST SURGICAL HISTORY:  The son does not remember, but no recent surgeries.    FAMILY HISTORY:  Dad with cancer. There is also  history of hyperlipidemia.   SOCIAL HISTORY:  No tobacco, alcohol or drug use. Lives with her husband, who is [id number] and wheelchair bound.   OUTPATIENT MEDICATIONS:  Aspirin 81 mg daily, cephalexin 500 mg daily, desoximetasone topical 0.05% topical cream, apply to left ankle 2 times a day. Exelon patch 9.5 mg per 24 hours transdermal, 1 patch daily. Multivitamin 1 Gummy orally chew daily, Ocuvite 1 tab once a day, sertraline 25 mg daily, Spiriva 18 mcg daily, Vasculera 630 mg oral tab daily, vitamin D 3000 international units once a day in the morning.   REVIEW OF SYSTEMS:  Unable to fully obtain as the patient has significant dementia.   PHYSICAL EXAMINATION: VITAL SIGNS: Temperature 98.1, pulse 82, respiratory rate 18, blood pressure 136/69, O2 sat  96% on room air.  GENERAL:  The patient is a well-developed Caucasian female, lying in bed, in no obvious distress. Has a cervical collar.  HEENT:  The patient with significant left frontal and periorbital ecchymoses.   Pupils appear to be equal and reactive.  Moist mucous membranes. Anicteric sclerae.  NECK:  No thyroid tenderness. The patient has a cervical collar on.  LUNGS:  Clear to auscultation without wheezing, rhonchi or rales.  CARDIOVASCULAR:  S1, S2 regular rate and rhythm. No murmurs, rubs or gallops.  ABDOMEN:  Soft , nontender, nondistended. Positive bowel sounds in all quadrants.  EXTREMITIES: No significant lower extremity edema. There is a fine erythema without tenderness or edema in the left lower extremity, going to slightly above mid shin with significant  varicose veins.  NEUROLOGIC:  Unable to do full neuro exam given significant pain on the lower extremities, but cranial nerves appear to be grossly intact, II through XII. Strength is 5/5 in the right upper extremity. Left upper extremity is in a cast. Did not do strength on the lower extremities given significant pain with movement, 2+ pulses in lower extremities, appear to be warm.   PSYCHIATRIC:  Pleasant, cooperative, awake, alert, oriented x 2.  Recognizes her son and she is in the hospital.    IMAGING DATA: CT of the head without contrast as above. CT C-spine without contrast showing no acute osseous injury. X-rays of left wrist complete showing angulated, severely displaced, overriding fracture of the distal left radial metaphysis. Let hip complete showing angulated left femoral neck fracture. Chest x-ray, 1 view, showing no acute disease of the chest. EKG: Normal sinus rhythm, rate is 87, no acute ST or T wave changes.   ASSESSMENT AND PLAN: We have a pleasant 81 year old female with dementia, frequent falls in the last month or so, living with her 79 year old wheelchair-bound husband, status post fall again today, and sustained left forehead hematoma, left wrist and hip fractures, admitted to orthopedic service. The  fall appears to be mechanical without any loss of consciousness. At this point, given no chest pains or significant EKG changes, and the fact that she is usually ambulatory around the house without complaints of shortness of breath and without active chest pains, we would recommend proceeding to the OR without any further cardiac testing. The patient is a mild to moderate risk patient, given advanced age and dementia. She is not on a beta blocker, and I would not start one at this point. Vitals appear to be stable. I would agree with resumption of Exelon patch and Spiriva at this point. Would also continue Keflex at outpatient dosage for her off and on cellulitis, which her son states appears to be much improved than prior. We would have to watch her carefully postop for delirium, as she is a high risk for that. Her asthma appears to be controlled as well at this point. She is not hypoxic. We would add nebs p.r.n. in case of shortness of breath. Per my conversation with the son, the patient is DNR and CODE STATUS will be changed.  Deep vein prophylaxis once the patient  is at acceptable bleeding risk per orthopedics.   TOTAL TIME SPENT: 50 minutes.   ____________________________ Krystal Eaton, MD sa:dmm D: 06/24/2012 20:25:49 ET T: 06/24/2012 21:04:42 ET JOB#: [id number]  cc: Krystal Eaton, MD, <Dictator> Krystal Eaton MD ELECTRONICALLY SIGNED 06/25/2012 13:58

## 2014-06-22 NOTE — Consult Note (Signed)
Brief Consult Note: Diagnosis: preop eval.   Patient was seen by consultant.   Consult note dictated.   Recommend to proceed with surgery or procedure.   Recommend further assessment or treatment.   Orders entered.   Discussed with Attending MD.   Comments: 81 yo with dementia, with frequent falls in last month or so, living with 79yo wheelchair bound husband s/p fall and left forhead hematoma, left wrist/hip fx. states fall was mechanical as she tripped. son in room.  pt ambulates w/t sig sob/cp  per son, at least no complains to them.  ekg shows nsr, no tw/st changes. no cp.  possible mets>4 and given no sig ekg changes and fall mechanical proceed to OR w/t further cardiac test. mild-mod risk pt. son aware. moniter carefully for post op delirium.  resume keflex as op dosage as she has been on it chronically for off and on LEE cellulitis.  pt is dnr. will change code status.  Electronic Signatures: Krystal EatonAhmadzia, Kateline Kinkade (MD)  (Signed 25-Apr-14 20:09)  Authored: Brief Consult Note   Last Updated: 25-Apr-14 20:09 by Krystal EatonAhmadzia, Jenelle Drennon (MD)

## 2014-06-22 NOTE — Op Note (Signed)
PATIENT NAME:  [name], Diannia B MR#:  [id number] DATE OF BIRTH:  [birth year]  DATE OF PROCEDURE:  08/24/2012  PREOPERATIVE DIAGNOSES:   1.  Hip fracture. 2.  Postoperative anemia secondary to blood loss. 3.  Chronic obstructive pulmonary disease. 4.  Lack of appropriate intravenous access.  POSTOPERATIVE DIAGNOSES:  1.  Hip fracture. 2.  Postoperative anemia secondary to blood loss. 3.  Chronic obstructive pulmonary disease. 4.  Lack of appropriate intravenous access.  PROCEDURES:  1. Ultrasound guidance for vascular access to right basilic vein.  2. Fluoroscopic guidance for placement of catheter.  3. Insertion of Morpheus single-lumen peripherally inserted central venous catheter, right  arm.  SURGEON: Levora DredgeGregory Schnier, MD  ANESTHESIA: Local.   ESTIMATED BLOOD LOSS: Minimal.   INDICATION FOR PROCEDURE: Requiring IV medications greater than 5 days.  DESCRIPTION OF PROCEDURE: The patient's right arm was sterilely prepped and draped, and a sterile surgical field was created. The basilic vein was accessed under direct ultrasound guidance without difficulty with a micropuncture needle and permanent image was recorded. 0.018 wire was then placed into the superior vena cava. Peel-away sheath was placed over the wire. A single lumen peripherally inserted central venous catheter was then placed over the wire and the wire and peel-away sheath were removed. The catheter tip was placed into the superior vena cava and was secured at the skin at 36 cm with a sterile dressing. The catheter withdrew blood well and flushed easily with heparinized saline. The patient tolerated procedure well.  ____________________________ Renford DillsGregory G. Schnier, MD ggs:sb D: 09/06/2012 16:14:39 ET T: 09/06/2012 16:32:54 ET JOB#: [id number]  cc: Renford DillsGregory G. Schnier, MD, <Dictator> Renford DillsGREGORY G SCHNIER MD ELECTRONICALLY SIGNED 09/17/2012 11:11

## 2014-06-22 NOTE — H&P (Signed)
Subjective/Chief Complaint Unwitnessed fall. Left wrist and left hip pain.   History of Present Illness Patient presents to [facility] ED via ambulance due to unwitnessed fall. Pain with motion of left hip.  Deformity and pain of left wrist.   Past Med/Surgical Hx:  Osteoporosis:   ALLERGIES:  Sulfa drugs: Unknown  PCN: Unknown  HOME MEDICATIONS: Medication Instructions Status  Exelon 9.5 mg/24 hr transdermal film, extended release 1 patch transdermal once a day (in the morning) Active  Spiriva 18 mcg inhalation capsule 1 cap(s) via handihaler once a day (in the morning). Active  Vitamin D3 1000 intl units oral capsule 1 cap(s) orally once a day (in the morning) Active  Aspirin Enteric Coated 81 mg oral delayed release tablet 1 tab(s) orally once a day (in the evening) Active  multivitamin Chew and swallow 1 gummie orally once a day. Active  cephalexin 500 mg oral capsule 1 cap(s) orally once a day Active  Ocuvite Antioxidant Multiple Vitamins and Minerals oral tablet 1 tab(s) orally once a day (in the evening) Active  desoximetasone topical 0.05% topical cream Apply to left ankle topically 2 times a day. Active  Vasculera 630 mg oral tablet 1 tab(s) orally once a day Active  sertraline 25 mg oral tablet 1 tab(s) orally once a day Active   Family and Social History:  Place of Living Home    Review of Systems:  Subjective/Chief Complaint Left wrist pain, left hip pain, neck pain. Unable to provide ROS.   ROS Pt not able to provide ROS   Medications/Allergies Reviewed Medications/Allergies reviewed   Physical Exam:  GEN well developed, well nourished   HEENT left periorbital ecchymosis and forehad hematoma   NECK C-collar in place    RESP normal resp effort    CARD regular rate  ble varicosities    GU foley catheter in place    Additional Comments LLE shortened and exteranlly rotated, palpable pulses and motor intact. LUE in splint s/p reduction   Lab Results:  Routine  Chem:  25-Apr-14 14:36   Glucose, Serum  168  BUN  20  Creatinine (comp) 0.74  Sodium, Serum 138  Potassium, Serum 3.7  Chloride, Serum 105  CO2, Serum 30  Calcium (Total), Serum  8.3  Anion Gap  3  Osmolality (calc) 282  eGFR (African American) >60  eGFR (Non-African American) >60 (eGFR values <6m/min/1.73 m2 may be an indication of chronic kidney disease (CKD). Calculated eGFR is useful in patients with stable renal function. The eGFR calculation will not be reliable in acutely ill patients when serum creatinine is changing rapidly. It is not useful in  patients on dialysis. The eGFR calculation may not be applicable to patients at the low and high extremes of body sizes, pregnant women, and vegetarians.)  Routine Hem:  25-Apr-14 14:36   WBC (CBC) 8.0  RBC (CBC) 4.06  Hemoglobin (CBC)  11.9  Hematocrit (CBC) 37.0  Platelet Count (CBC) 181 (Result(s) reported on 24 Jun 2012 at 02:58PM.)  MCV 91  MCH 29.3  MCHC 32.1  RDW  15.1   Radiology Results: XRay:    25-Apr-14 15:03, Chest 1 View AP or PA  Chest 1 View AP or PA  REASON FOR EXAM:    PREOPERATIVE, FALL  COMMENTS:       PROCEDURE: DXR - DXR CHEST 1 VIEWAP OR PA  - Jun 24 2012  3:03PM     RESULT: Comparison: None    Findings:     Single portable  AP chest radiograph is provided. The lungs are   hyperinflated likely secondary to COPD. There is no focal parenchymal   opacity, pleural effusion, or pneumothorax. Normal cardiomediastinal   silhouette. The osseous structures are unremarkable.    IMPRESSION:   No acute disease of the chest.    Dictation Site: 1        Verified By: Jennette Banker, M.D., MD    25-Apr-14 15:03, Hip Left Complete  Hip Left Complete  REASON FOR EXAM:    fall pain  COMMENTS:       PROCEDURE: DXR - DXR HIP LEFT COMPLETE  - Jun 24 2012  3:03PM     RESULT: History: Fall.    Findings: Angulated fracture of the left femoral neck is present.   Degenerative change present about the  left hip and SI joint.    IMPRESSION:  Angulated left femoral neck fracture.        Verified By: Osa Craver, M.D., MD    25-Apr-14 15:03, Wrist Left Complete  Wrist Left Complete  REASON FOR EXAM:    fall, deformity  COMMENTS:       PROCEDURE: DXR - DXR WRIST LT COMP WITH OBLIQUES  - Jun 24 2012  3:03PM     RESULT: Angulated posterior displaced fracture of the distal left radial   metaphysis is present. Severe osteopenia and degenerative change present.    IMPRESSION:  Angulated severely displaced overriding fracture of the   distal left radial metaphysis.        Verified By: Osa Craver, M.D., MD  LabUnknown:    25-Apr-14 15:03, Chest 1 View AP or PA  PACS Image    25-Apr-14 15:03, Hip Left Complete  PACS Image    25-Apr-14 15:03, Wrist Left Complete  PACS Image    25-Apr-14 15:20, CT Head Without Contrast  PACS Image  CT:  CT Head Without Contrast  REASON FOR EXAM:    hematoma, fall, blood thinners  COMMENTS:       PROCEDURE: CT  - CT HEAD WITHOUT CONTRAST  - Jun 24 2012  3:20PM     RESULT: Noncontrast CT of the brain is performed emergently. The patient   has no previous study for comparison.    There is prominence of the ventricles and sulci consistent with diffuse   atrophy. Basal ganglia calcification is present. There is no intracranial   hemorrhage, mass, mass effect or midline shift. No evolving infarct is   evident. There is a largefrontal scalp hematoma extending into the   periorbital region on the left the globes appear to be intact. Orbital   structures are unremarkable. The bone window images included showed no   definite fracture. There is a normal appearing aeration of the paranasal     sinuses and mastoid air cells.    IMPRESSION:   1. Changes of atrophy with chronic microvascular ischemic disease. No   acute intracranial abnormality. No acute bony abnormality evident. Large   left periorbital and frontal scalp  hematoma.    Dictation Site: 1        Verified By: Sundra Aland, M.D., MD    Assessment/Admission Diagnosis Left distal radius fracture, comminuted and intra-articular. Left femoral neck fracture, displaced.   Plan Awaiting medical clearance.  Will attempt CR of wrist in ED.  NPO after midnight.  Plan for surgery tomorrow if medically cleared.   Electronic Signatures: Claud Kelp (MD)  (Signed 25-Apr-14 19:19)  Authored: FAMILY AND SOCIAL  HISTORY, PHYSICAL EXAM Marteze Vecchio (MD)  (Signed 25-Apr-14 18:33)  Authored: CHIEF COMPLAINT and HISTORY, PAST MEDICAL/SURGIAL HISTORY, ALLERGIES, HOME MEDICATIONS, REVIEW OF SYSTEMS, LABS, Radiology, ASSESSMENT AND PLAN   Last Updated: 25-Apr-14 19:19 by Claud Kelp (MD)

## 2014-06-22 NOTE — Consult Note (Signed)
Brief Consult Note: Diagnosis: preop medical clearance.   Patient was seen by consultant.   Consult note dictated.   Recommend to proceed with surgery or procedure.   Orders entered.   Comments: 1. preop medical clearance: medically clear for planned surgery. no active symptoms, ekg not showing any acute st-t changes. mild-moderate risk for planned surgery considering her age, co-modbidities. explained and updated family. most common issues could be postop mental status changes, anemia, stroke but rare.  2. history of chronic obstructive pulmonary disease: seems stable, duoneb postop, not wheezing  3. history of dementia: doing quite well for her age, communicates and alert now.  4. osteoporosis: continue vit D  dictation # [id number]  DNR and confirmed with her daughter at bedside..  Electronic Signatures: Patricia PesaShah, Taden Witter S (MD)  (Signed 21-May-14 17:40)  Authored: Brief Consult Note   Last Updated: 21-May-14 17:40 by Patricia PesaShah, Roland Lipke S (MD)

## 2014-06-22 NOTE — Discharge Summary (Signed)
PATIENT NAME:  [name], Evona B MR#:  [id number] DATE OF BIRTH:  1918-03-02  DATE OF ADMISSION:  07/20/2012 DATE OF DISCHARGE:  07/25/2012  ADMITTING DIAGNOSIS: Right femoral intertrochanteric fracture.  DISCHARGE DIAGNOSIS: Right femoral intertrochanteric fracture.  OPERATION: On 07/21/2012, she had an IM nail for an intertrochanteric femur fracture.   SURGEON: Dr. Murlean HarkShalini Ramasunder, MD  ANESTHESIA: Spinal.   ESTIMATED BLOOD LOSS: 500 mL.   FLUIDS REPLACED: Blood.   DRAINS: Hemovac.   IMPLANTS:  [employer].  COMPLICATIONS: Precipitous drop in blood pressure during closure.   HISTORY: Ms. Shiela Mayeretersen is a 81 year old female who fell in the bathroom while being assisted by her daughter. She had immediate pain in the right hip. [facility] ED where x-rays confirmed a right intertrochanteric femur fracture.   PHYSICAL EXAMINATION: GENERAL: No acute distress, thin.  HEENT: Pale conjunctivae, dry oral mucosa.  NECK: Supple. Trachea midline.  LUNGS:  Normal respiratory effort. Mild wheeze. HEART:  Regular rate. No thrills. No lower extremity edema.  ABDOMEN: Denies tenderness, soft, hypoactive bowel sounds, no abdominal mass.  GENITOURINARY: Foley catheter in place.  EXTREMITIES: Right leg in Buck's traction. Sensation grossly intact to touch. DP palpable.  SKIN: Normal to palpation. No ulcers. Skin turgor decreased.  PSYCHIATRIC: Alert, arousal, communicates. Aware of injury.   HOSPITAL COURSE: After initial admission on 07/20/2012, she underwent ORIF of the right hip with IM nail, on 07/21/2012. Upon closure of the wound in the operating room, she became hypotensive. She was given 1 unit of blood and was stabilized in the CCU.  On postoperative day 1, she had adequate urine output. She had minimal output of her Hemovac. She was transferred to the floor later that day. Her IV fluids were increased due to her low urine output. Physical therapy was begun on that day. On  postoperative day 2, 07/23/2012, she continued to have low urine output. She was maintained on telemetry with normal sinus rhythm. Physical therapy was continued and she continued to make slow progress with physical therapy. Her hemoglobin was 7.1, which was down from 7.8 immediately after her surgery. On postoperative day 3, which is 07/24/2012, her hemoglobin was 6 and the decision was made to transfuse 2 units of blood. She continued to have low urine output, but bladder scans showed zero mL. She had a temperature up to 100.9. She continued to make slow progress with physical therapy. On postoperative day 4, 07/25/2012, after getting 2 units of blood, her hemoglobin was 9.3. Her white blood cells have gradually increased and they are now 12.6. She has had no fever. Because of increased drainage from her right hip, a wound VAC was placed. She is stable and ready for discharge.   CONDITION AT DISCHARGE: Stable.   DISPOSITION:  The patient was discharged to rehab.   DISCHARGE INSTRUCTIONS:  The patient will follow up with [facility]ics in 2 weeks for staple removal. She may be weight-bearing as tolerated on the right lower extremity and will get physical therapy to assist with this. She will use knee-high TED hose bilaterally. Her diet is regular.   DISCHARGE MEDICATIONS: 1.  Exelon 9.5 mg transdermal extended release 1 patch transdermal once daily q. a.m.  2.  Spiriva 800 mcg inhalation 1 cap via HandiHaler q. a.m.  3.  Vitamin D3 1000 international units 1  cap p.o. q. a.m.  4.  Aspirin enteric-coated 81 mg delayed release 1 tab p.o. q. evening.  5.  Multivitamin chew and swallow 1  gummy oral once daily.  6.  Ocuvite antioxidant multivitamin 1 tab p.o. q. evening.  7.  Desoximetasone topical 0.05% apply to left ankle topically 2 times daily.  8.  Vasculera 630 mg oral 1 tab p.o. once daily.  9.  Sertraline 25 mg 1 tab p.o. once daily.  10.  Docusate sodium 100 mg 2 caps p.o. b.i.d.   11.  Acetaminophen/hydrocodone 325/5 mg 1 tab p.o. q. 4 hours p.r.n. pain.  12.  Acetaminophen 325 mg 2 tabs p.o. q. 4 hours p.r.n. temperature greater than 100.5.  13.  Tramadol 50 mg 1 tab p.o. q. 6 hours p.r.n. pain.  14.  Lovenox 40 mg injection sub-Q once daily x 2 weeks.  15.  Ferrous sulfate 325 mg 1 tabs p.o. b.i.d. with meals.  16.  Bisacodyl 10 mg rectal suppository 1 suppository rectal at bedtime p.r.n. constipation.   ____________________________ Raygen Linquist M. Haskel Khan, NP amb:sb D: 07/25/2012 07:31:53 ET T: 07/25/2012 08:33:30 ET JOB#: [id number]  cc: Bryceton Hantz M. Haskel Khan, NP, <Dictator> Burt Ek Airi Copado FNP ELECTRONICALLY SIGNED 08/11/2012 9:37

## 2014-06-22 NOTE — H&P (Signed)
Subjective/Chief Complaint Right hip pain   History of Present Illness Two weeks s/p left hip hemiarthroplasty and left wrist ex-fix.  D/c' d from SNF yesterday and today fell in bathroom while being assisted by daughter.  Pain in right hip.   Past Med/Surgical Hx:  Osteoporosis:   LEFT HIP HEMIARTHOPLASTY:   ORIF LEFT DISTAL RADIUS FX:   ALLERGIES:  Sulfa drugs: Unknown  PCN: Unknown  HOME MEDICATIONS: Medication Instructions Status  acetaminophen-HYDROcodone 325 mg-5 mg oral tablet 1 tab(s) orally every 4 hours, As needed, pain Active  docusate sodium 100 mg oral capsule 2 cap(s) orally 2 times a day Active  Exelon 9.5 mg/24 hr transdermal film, extended release 1 patch transdermal once a day (in the morning) Active  Spiriva 18 mcg inhalation capsule 1 cap(s) via handihaler once a day (in the morning). Active  Vitamin D3 1000 intl units oral capsule 1 cap(s) orally once a day (in the morning) Active  Aspirin Enteric Coated 81 mg oral delayed release tablet 1 tab(s) orally once a day (in the evening) Active  multivitamin Chew and swallow 1 gummie orally once a day. Active  Ocuvite Antioxidant Multiple Vitamins and Minerals oral tablet 1 tab(s) orally once a day (in the evening) Active  desoximetasone topical 0.05% topical cream Apply to left ankle topically 2 times a day. Active  Vasculera 630 mg oral tablet 1 tab(s) orally once a day Active  sertraline 25 mg oral tablet 1 tab(s) orally once a day Active   Family and Social History:  Family History Non-Contributory   Social History negative tobacco, negative ETOH, negative Illicit drugs   Place of Living Home   Review of Systems:  Subjective/Chief Complaint right hip pain   Fever/Chills No   Cough No   Sputum No   Abdominal Pain No   Diarrhea No   Constipation Yes   Nausea/Vomiting No   SOB/DOE No   Chest Pain No   Dysuria No   Tolerating PT Yes  for opposite hip   Tolerating Diet Yes    Medications/Allergies Reviewed Medications/Allergies reviewed   Physical Exam:  GEN no acute distress, thin   HEENT pale conjunctivae, dry oral mucosa   NECK supple  trachea midline   RESP normal resp effort  mild wheeze   CARD regular rate  no thrills  No LE edema   ABD denies tenderness  soft  hypoactive BS  no Adominal Mass   GU foley catheter in place   EXTR right leg in buck's traction. Sensation grossly intact to touch.  DP palpable   SKIN normal to palpation, No ulcers, skin turgor decreased   PSYCH alert, arouseable and communicative.  Aware of injury   Lab Results: Hepatic:  21-May-14 08:30   Bilirubin, Total 0.4  Alkaline Phosphatase 101  SGPT (ALT) 32  SGOT (AST) 29  Total Protein, Serum 7.0  Albumin, Serum  2.9  Routine Chem:  21-May-14 08:30   Glucose, Serum 99  BUN 18  Creatinine (comp) 0.78  Sodium, Serum 139  Potassium, Serum 3.6  Chloride, Serum 105  CO2, Serum 30  Calcium (Total), Serum 8.5  Osmolality (calc) 279  eGFR (African American) >60  eGFR (Non-African American) >60 (eGFR values <65m/min/1.73 m2 may be an indication of chronic kidney disease (CKD). Calculated eGFR is useful in patients with stable renal function. The eGFR calculation will not be reliable in acutely ill patients when serum creatinine is changing rapidly. It is not useful in  patients on dialysis.  The eGFR calculation may not be applicable to patients at the low and high extremes of body sizes, pregnant women, and vegetarians.)  Anion Gap  4  Cardiac:  21-May-14 08:30   Troponin I < 0.02 (0.00-0.05 0.05 ng/mL or less: NEGATIVE  Repeat testing in 3-6 hrs  if clinically indicated. >0.05 ng/mL: POTENTIAL  MYOCARDIAL INJURY. Repeat  testing in 3-6 hrs if  clinically indicated. NOTE: An increase or decrease  of 30% or more on serial  testing suggests a  clinically important change)  Routine UA:  21-May-14 08:30   Color (UA) Yellow  Clarity (UA) Clear  Glucose  (UA) Negative  Bilirubin (UA) Negative  Ketones (UA) Negative  Specific Gravity (UA) 1.010  Blood (UA) Negative  pH (UA) 6.0  Protein (UA) Negative  Nitrite (UA) Negative  Leukocyte Esterase (UA) Negative (Result(s) reported on 20 Jul 2012 at 09:04AM.)  RBC (UA) NONE SEEN  WBC (UA) <1 /HPF  Bacteria (UA) NONE SEEN  Epithelial Cells (UA) <1 /HPF  Mucous (UA) PRESENT (Result(s) reported on 20 Jul 2012 at 09:04AM.)  Routine Coag:  21-May-14 08:30   Activated PTT (APTT) 25.2 (A HCT value >55% may artifactually increase the APTT. In one study, the increase was an average of 19%. Reference: "Effect on Routine and Special Coagulation Testing Values of Citrate Anticoagulant Adjustment in Patients with High HCT Values." American Journal of Clinical Pathology 2006;126:400-405.)  Prothrombin 13.7  INR 1.0 (INR reference interval applies to patients on anticoagulant therapy. A single INR therapeutic range for coumarins is not optimal for all indications; however, the suggested range for most indications is 2.0 - 3.0. Exceptions to the INR Reference Range may include: Prosthetic heart valves, acute myocardial infarction, prevention of myocardial infarction, and combinations of aspirin and anticoagulant. The need for a higher or lower target INR must be assessed individually. Reference: The Pharmacology and Management of the Vitamin K  antagonists: the seventh ACCP Conference on Antithrombotic and Thrombolytic Therapy. BWIOM.3559 Sept:126 (3suppl): [id number]. A HCT value >55% may artifactually increase the PT.  In one study,  the increase was an average of 25%. Reference:  "Effect on Routine and Special Coagulation Testing Values of Citrate Anticoagulant Adjustment in Patients with High HCT Values." American Journal of Clinical Pathology 2006;126:400-405.)  Routine Hem:  21-May-14 08:30   WBC (CBC) 6.7  RBC (CBC)  3.39  Hemoglobin (CBC)  10.2  Hematocrit (CBC)  30.9  Platelet Count  (CBC) 383 (Result(s) reported on 20 Jul 2012 at 08:51AM.)  MCV 91  MCH 30.1  MCHC 33.0  RDW  16.3    Assessment/Admission Diagnosis Right hip intertroch fracture   Plan Plan for IM nail right hip tomorrow. Awaiting medical clearance. Explained in detail risk of mortality.  Family shows full understanding of risk. Patient is DNR.   Electronic Signatures: Dawayne Patricia (MD)  (Signed 21-May-14 16:39)  Authored: CHIEF COMPLAINT and HISTORY, PAST MEDICAL/SURGIAL HISTORY, ALLERGIES, HOME MEDICATIONS, FAMILY AND SOCIAL HISTORY, REVIEW OF SYSTEMS, PHYSICAL EXAM, LABS, ASSESSMENT AND PLAN   Last Updated: 21-May-14 16:39 by Dawayne Patricia (MD)

## 2014-06-22 NOTE — Discharge Summary (Signed)
PATIENT NAME:  [name], [name] MR#:  [id number] DATE OF BIRTH:  24-Feb-1918  DATE OF ADMISSION:  06/24/2012 DATE OF DISCHARGE:  06/28/2012  ADMITTING DIAGNOSES: Left femoral neck fracture, and left distal radius fracture.   DISCHARGE DIAGNOSES Left femoral neck fracture, and left distal radius fracture.   OPERATION: On 06/25/2012 she had a left hip hemiarthroplasty and external fixation of the left distal radius.   SURGEON: Thornton Papasobin Eckel.   ANESTHESIA: General.   COMPLICATIONS: None.   ESTIMATED BLOOD LOSS: 250 mL.   IMPLANTS FOR THE LEFT HIP: [employer]. Please see op note for more details.    HISTORY: [name] is a 81 year old female who fell on 06/24/2012. Her fall was unwitnessed. She [facility] ED where the same day x-rays confirmed a left femoral neck fracture.   PHYSICAL EXAMINATION: GENERAL: Well-developed, well-nourished.  HEENT: Left periorbital ecchymosis and forehead hematoma.  NECK: C-collar in place.  RESPIRATORY: Normal respiratory effort.  CARDIOVASCULAR: Regular rate and rhythm. Bilateral lower extremity varicosities.  GENITOURINARY: Foley catheter in place.  MUSCULOSKELETAL: Left lower extremity shortened and externally rotated. Palpable pulses, and motor intact. Left upper extremity in splint, status post reduction.   HOSPITAL COURSE: After initial admission on 06/25/2011 medicine was consulted for medical clearance for surgery. On 06/25/2012 she had surgery of a left hip hemiarthroplasty and ORIF of the left distal radius. She had good pain control afterwards and was brought to the orthopedic floor from the PACU. Her initial hemoglobin on postoperative day 1, 06/27/2011 was 7.8, which was down from 12.2. A decision was made to transfuse 1 unit of packed red blood cells. Her repeat hemoglobin the next day, on postoperative day 2, was 8.0. Physical therapy was begun on postoperative day 1, and she tolerated it very well. On postoperative day 2 she  continued to tolerate physical therapy well and had increased distance and good effort. Her hemoglobin on postoperative day 2 was 8.0. Her daughter was at the bedside. She continues to be confused. Repeat hemoglobin on postoperative on postoperative day 3, on 06/28/2012, was 7.9. Hematocrit was 23.0. She was constipated, and so a bowel regimen was ordered. She has an abduction pillow while in bed.   CONDITION AT DISCHARGE: Stable.   DISPOSITION: The patient was sent to rehab.   DISCHARGE INSTRUCTIONS: The patient will follow up with [facility] in 2 weeks for staple removal. She will do physical therapy and will be weightbearing as tolerated on the left lower extremity, and nonweightbearing on the left upper extremity.   Her diet is regular.   TED hose, knee-high bilaterally. Abduction pillow should be used when in the bed and in the chair.   Dressing of the left hip should be changed once daily, and as needed. Left upper extremity pin site should be kept clean and dry. It can be wrapped with a Xeroflo. She may use a platform walker to ambulate so as to  not weight-bear on the left upper extremity.   DISCHARGE MEDICATIONS: 1.  Exelon 9.5 mg transdermal extended release patch, 1 patch transdermal once daily q.a.m.  2.  Spiriva 18 mcg inhalation, one cap HandiHaler once daily in q.a.m.  3.  Vitamin D3, 1000 international units, 1 capsule p.o. once daily q.a.m.  4.  Aspirin, enteric-coated, 81 mg, 1 tablet p.o. q. evening.  5.  Multivitamin, chew-and-swallow, one Gummy orally once daily.  6.  Cephalexin 500 mg, 1 capsule p.o. once daily.  7.  Ocuvite antioxidant multivitamin and mineral  oil tablet, 1 tablet p.o. q. evening.  8.  Desoximetasone topical 0.05% topical cream, apply to left ankle topically 2 tablets daily.  9.  Vasculera 630 mg oral tablet, 1 tablet p.o. once daily.  10.  Sertraline 125 mg 1 tablet p.o. once daily.  11.  Acetaminophen/hydrocodone 325/5 mg 1 tablet  p.o. q.4 hours p.r.n., pain.  12.  Magnesium hydroxide 8%, 30 mL p.o. twice daily p.r.n., constipation.  13.  Enoxaparin 40 mg subcutaneous once daily x 2 weeks.  14.  Senna 1 tablet p.o. Vargas.i.d. p.r.n., constipation.  15. Bisacodyl 10 mg rectal suppository, 1 suppository p.r. twice daily p.r.n., constipation.  16.  Docusate 100 mg p.o. 2 capsules oral twice daily.  17.  Ensure 240 mL oral 3 times a day with meals.      ____________________________ Mikeal Winstanley M. Haskel Khan, NP amb:dm D: 06/28/2012 12:21:06 ET T: 06/28/2012 12:40:17 ET JOB#: [id number]  cc: Dasan Hardman M. Haskel Khan, NP, <Dictator> Burt Ek Tajuana Kniskern FNP ELECTRONICALLY SIGNED 07/15/2012 14:00

## 2014-06-22 NOTE — Op Note (Signed)
PATIENT NAME:  [name], [name] DATE OF BIRTH:  [birth year]  DATE OF PROCEDURE:  06/25/2012  PREOPERATIVE DIAGNOSES: 1.  Left displaced femoral neck fracture.  2.  Left distal radius fracture.   POSTOPERATIVE DIAGNOSES:   1.  Left displaced femoral neck fracture.  2.  Left distal radius fracture.   PROCEDURES PERFORMED:  1.  Left hip hemiarthroplasty.  2.  Closed reduction with percutaneous pinning and external fixation of left distal radius.  STAFF SURGEON OF RECORD:  Dr. Thornton Papasobin Gwendolen Hewlett   ANESTHESIA:  General.  FLUIDS ADMINISTERED: 1500 of crystalloid.   ESTIMATED BLOOD LOSS:  250 mL   TOTAL TOURNIQUET TIME:  For the wrist procedure was 52 minutes.   COMPLICATIONS:  None.  DISPOSITION:  Stable to PACU upon transport.   INDICATIONS FOR PROCEDURE:  This is a 81 year old independent community ambulator who fell yesterday and sustained the above-mentioned fractures. She and her children were counseled regarding risks and benefits of surgery. The risks to include, but not limited to bleeding, infection, damage to nerves and/or vessels, need for additional surgical procedures. They were also counseled regarding the risks of DVT. The patient and her family agreed to proceed with surgery after weighing all the risks.   DESCRIPTION OF PROCEDURE:  The patient was brought to the Operating Room and placed on the operating room table with all extremities appropriately padded. The patient was placed in the lateral position on the pegboard with her left hip up. Axillary roll was placed, and all  extremities were, again, made sure to be adequately padded. Her left hip was prepped and draped in sterile fashion. A timeout was then performed with anesthesia, nursing staff and surgeon of record to confirm surgical site, patient medical record number, date of birth, laterality, procedure to be performed, confirmation of antibiotics given and notation of any allergies. At this point, a  posterior approach to the left hip was performed with skin incision down to the fascia. The fascia was incised sharply. The gluteal muscle fibers were separated to expose the short external rotators. The piriformis was taken down and tagged. Next, a capsulotomy was performed, and the capsule was also tagged. At this point, the head was removed from the hip socket with the aid of a corkscrew. We next revised our femoral neck cut, and then used the initiator to create our pilot hole. We then used the canal finder followed by the lateralizer, all in  preparation to begin our broaching. We sequentially broached up to a size 4 broach, and felt we had good fit. The previously extracted head measured to 48, so we trialed a +0 48 on her 4-stem, and felt we had good range of motion and stability. So we next removed our 4 broach and sized our cement restrictor to a size 3. We then used the canal brush to clean the canal, followed by irrigating and pulse lavage to clean out the femoral canal. We placed our cement restrictor, and then packed the canal with gauze while we prepared our cement. Once the cement was mixed, we placed it in the femoral canal and appropriately pressurized the cement. We then took our size 4 [employer] stem with a centralizer attached distally and placed it down the femoral shaft. We allowed 13 minutes for the cement to cure. We again placed a +0 48 head trial on, reduced the hip and confirmed good stability and motion afforded by this size. We then opened our final implant head and secured  it in place with the [facility] taper on the femoral stem. We reduced the hip one final time and again checked her motion and stability. At this point, the wound was irrigated with sterile saline and the leg was placed on a padded Mayo to maintain abduction, external rotation to facilitate our posterior repair. A drill hole was placed in the greater trochanter and the capsule was repaired through this drill hole in the  abductor tendon. We next brought the piriformis up and sutured that to the abductor tendon as well. At this point, the fascia was closed with running suture. We then used Vicryl to close subcuticular layer and staples on the skin. Sterile dressing was applied, and the patient was placed in the supine position.   At this point, we prepped and draped the left upper extremity for the second portion of the case. A second timeout was performed for the left upper extremity distal radius closed reduction and external fixation. The left upper extremity was exsanguinated with a 4 inch Esmarch and tourniquet was inflated to 250 mmHg. We made a small incision over the radius approximately 10 cm proximal to the styloid. Meticulous dissection to avoid the sensory branch of the radial nerve down to the distal radius was performed. Two 4 mm half-pins were placed and position confirmed on fluoroscopic imaging. We then made a similar incision overlying the index finger metacarpal again with dissection down to the metacarpal bone and placed 2 more half-pins. We then constructed our external fixator and performed a closed reduction, and then tightened the external fixator in place when adequate reduction was achieved. This was further augmented by two 2.0 mm K wires, one originating in the radial styloid down the radius and one going from radial to ulnar into the distal ulna to help stabilize her fracture. The reduction was felt to be adequate and would facilitate early ambulation using a platform walker. At this point, the incisions were closed with nylon suture on the pins. Sterile dressings were applied. Tourniquet was let down, and an Ace wrap was placed around the left upper extremity. She was then extubated and transferred to the PACU in stable condition.    ____________________________ Catherine Earthly, MD tte:mr D: 06/25/2012 13:49:21 ET T: 06/25/2012 17:48:59 ET JOB#: [id number]  cc: Catherine Earthly, MD,  <Dictator> Catherine Earthly MD ELECTRONICALLY SIGNED 06/26/2012 11:30

## 2014-06-22 NOTE — Consult Note (Signed)
PATIENT NAME:  [name], BERKEL MR#:  [id number] DATE OF BIRTH:  [birth year]  DATE OF CONSULTATION:  07/20/2012  CONSULTING PHYSICIAN:  Haniyah Maciolek S. Sherryll Burger, MD  PRIMARY CARE PHYSICIAN:  Dr. Ramon Dredge Lance/Dr Cora Daniels  REQUESTING PHYSICIAN:  Dr. Claris Gladden   REASON FOR CONSULTATION:  Preop medical clearance.   HISTORY OF PRESENT ILLNESS: The patient is a 81 year old female with a known history of COPD and dementia. Was admitted status post fall with right hip pain, and was found to have an intratrochanteric fracture, for which she was admitted by Orthopedics. We are consulted for medical clearance. The patient was just discharged from skilled nursing facility yesterday. Her daughter took her home, where, while going to potty, she slipped and fell on her right side. She was brought into the Emergency Department, and she was found to have right hip fracture. She denies any chest pain, shortness of breath, or any other symptoms at this time.   PAST MEDICAL HISTORY:   1.  Dementia, history of hallucinations. 2.  Osteoporosis, 3.  Asthma/COPD.    PAST SURGICAL HISTORY:  Left hip hemiarthroplasty about 3 weeks ago.    FAMILY HISTORY:  Father had cancer, also had hyperlipidemia.   SOCIAL HISTORY: No tobacco, alcohol or drug use. She was at [facility] rehab, and was brought to home yesterday with her daughter. She is usually wheelchair bound, as per record.   ALLERGIES:  PENICILLIN AND SULFA.   MEDICATIONS AT HOME: 1.  Acetaminophen/hydrocodone 325/5, 1 tablet p.o. every 4 hours as needed.  2.  Aspirin 81 mg p.o. daily. 3.  Desoximetasone topical 0.05% to left ankle topically twice a day.  4.  Colace 100 mg 2 capsules p.o. b.i.d.  5.  Exelon 1 patch transdermal once a day.  6.  Multivitamin once a day.  7.  Ocuvite once daily.  8.  Sertraline 25 mg p.o. daily.  9.  Spiriva once daily. 10.  Vasculera 630 mg p.o. daily.  11.  Vitamin D3, 1000 international units once daily.   REVIEW OF SYSTEMS:   Unable to obtain secondary to patient's dementia.   PHYSICAL EXAMINATION: VITAL SIGNS: Temperature 98, heart rate 79 per minute, respirations 20 per minute, blood pressure 137/59 mmHg, she is saturating 98% on room air.  GENERAL: The patient is a 81 year old female lying in the bed comfortably, without any acute distress.  EYES:  Pupils equal, round, react to light and accommodation. No scleral icterus. Extraocular muscles intact.  HEENT:  Head atraumatic, normocephalic. Oropharynx and nasopharynx clear.  NECK:  Supple, No JVD, No Thyroid Enlargement/tenderness.  LUNGS:  Clear to auscultation bilaterally. No wheezing, rales, rhonchi or crepitation.  CARDIOVASCULAR: S1, S2 normal. No murmur.  ABDOMEN: Soft, nontender, nondistended. Bowel sounds present. No organomegaly or masses.  EXTREMITIES: No pedal edema, cyanosis, clubbing. She does have significant tenderness on her right hip area, and her right leg is in traction. Sensation is grossly intact.  NEUROLOGIC: Cranial nerves II through XII seem intact. Muscle strength 4 to 5/5 in all extremities except right lower extremity, which was not checked as she was having significant pain. Sensation seems intact. PSYCHIATRIC:  The patient seems alert and oriented x 2. She was able to recognize her daughter. Could not recognize her date of birth, but she knew she was in [place]. Could not recall the county at first, but then she could also say [facility].  SKIN:  No obvious rash, lesion, although she does have chronic erythema in her left lower extremity.  LABORATORY PANEL:  Normal BMP. Normal liver function tests. Normal first set of cardiac enzymes. Normal thyroid function. Normal CBC, except hemoglobin of 10.2, hematocrit 30.9, platelets 383. Normal coagulation panel. Negative UA.   EKG showed normal sinus rhythm, no acute ST-T changes.   Chest x-ray in the ED showed COPD. No evidence of CHF or pneumonia.   Pelvic x-ray showed acute  intertrochanteric fracture of the right hip. No bony pelvic fracture.   Right hip x-ray showed mildly angulated intertrochanteric fracture of the right hip.   Left wrist x-ray on May 21 showed distal radius metaphysial fracture, which has undergone pinning. External fixation device in place. No definite acute radial fracture seen, but lucency is present to the distal ulnar metaphysis, which was not previously demonstrated. This may be due to the bony resorption and/or an occult fracture.   Left femur x-ray on May 21 showed no acute bony abnormality.   IMPRESSION AND PLAN: 1.  Preoperative medical clearance. The patient is medically cleared for planned surgery. She does not have any acute cardiopulmonary symptoms. Her EKG is not showing any acute ST-T changes. She is in normal sinus rhythm. She will be at mild to moderate risk for planned surgery, considering her advanced age and multiple comorbidities. This has been explained to patient and her family (daughter), who understands and agrees. Most common difficulties postoperatively could be mental status changes, considering her last dementia, and anemia, along with possible stroke or heart attack, but this would be rare.    2.  History of chronic obstructive pulmonary disease/asthma. Seems stable. Can consider DuoNeb  postoperatively if she has any wheezing. Currently she does not have any wheezing.   3.  History of dementia. She seems at her baseline right now. She does communicate, and seems alert at this time.   4.  History of osteoporosis. Would recommend continuing vitamin D tablets.   5.  Code status:  DNR, confirmed with her daughter.   Total time taking care of this patient was 55 minutes.    ____________________________ Ellamae SiaVipul S. Sherryll BurgerShah, MD vss:mr D: 07/20/2012 17:40:00 ET T: 07/20/2012 19:03:14 ET JOB#: [id number]  cc: Acquanetta Cabanilla S. Sherryll BurgerShah, MD, <Dictator> Murlean HarkShalini Ramasunder, MD Dr. Saddie BendersEdward Lantz  Ellamae SiaVIPUL S [facility] MD ELECTRONICALLY SIGNED  07/22/2012 15:11

## 2014-06-22 NOTE — Op Note (Signed)
PATIENT NAME:  [name], [name] MR#:  [id number] DATE OF BIRTH:  22-Sep-1917  DATE OF PROCEDURE:  07/21/2012  PREOPERATIVE DIAGNOSIS: Intertrochanteric fracture, right hip.   POSTOPERATIVE DIAGNOSIS: Intertrochanteric fracture, right hip.   PROCEDURE PERFORMED: Long intramedullary nail fixation, right hip.   SURGEON: Murlean Hark, M.D.   ASSISTANT: None.   IMPLANTS: [employer] Affixus  hip fracture nail 11 mm x 380 mm, with hip fracture nail lag screw 10.5 mm x 105 mm and distal cortical screw 5 mm x 40 mm.   ESTIMATED BLOOD LOSS: 500 mL.   ANESTHESIA: Spinal anesthesia.  SURGICAL FINDINGS: Intertrochanteric fracture of the right hip with severely osteoporotic bone.   COMPLICATIONS: During closure, the patient was significantly oozing, with no distinct bleeder being localized. The patient experienced significant difficulty maintaining her blood pressure. A second IV was started and the patient was given a fluid bolus, 1 unit of packed red blood cells, and placed temporarily on Neo-Synephrine. She stabilized. Postoperatively, she was  transferred to the CCU overnight for observation.   INDICATIONS FOR PROCEDURE: [name] is a 81 year old female who had fallen approximately 2-1/2 weeks ago, sustaining a fracture of the left hip and left wrist. She underwent surgical fixation of these injuries. Upon discharge home from skilled nursing facility, she fell, witnessed by her daughter in the bathroom. She sustained a fracture to the right hip as previously described. The patient was brought into the Emergency Room and admitted.   DESCRIPTION OF PROCEDURE: Risks and benefits were explained to the patient and her family. The high risk of blood loss and mortality associated with multiple hip fractures in a short period of time was explained in detail to the family. The patient is a DO NOT RESUSCITATE.   DESCRIPTION OF PROCEDURE: [name] was identified in the preoperative holding area. Right  hip had been marked on admission to the Emergency Room. The procedure was again explained to the family. The patient was brought into the Operating Room and placed on the fracture table in a supine position. She was secured. Spinal anesthesia had been administered. Right lower extremity was placed in a traction set-up. Left lower extremity was gently placed in a well-leg holder, taking care to ensure that her left hip hemiarthroplasty would not be placed at risk. Right lower extremity was prepared and draped in the usual sterile fashion. Surgical timeout was performed, identifying patient, confirming antibiotics, confirming images present being related to this patient, confirming laterality, and confirming consent form.   Under fluoroscopic guidance, the tip of the greater trochanter was marked. Incision was made just proximal to the tip of the trochanter. Blunt dissection was carried down to the tip of the trochanter. Pin was inserted and placement was confirmed on AP and lateral views of the hip. Starting reamer was used to open the femoral canal. Pin was removed and a ball-tipped guidewire was inserted. Placement was confirmed on orthogonal radiographs of the femur. Sequential reaming was undertaken to 13 mm. The femoral canal was measured to be 395 and therefore, a 380 mm nail was chosen. Nail was inserted. Approximate location of hip screw placement was confirmed on AP radiograph. Through the provided guide, a lateral incision was made for hip screw placement. A pin was placed into the hip in an appropriate position, confirmed on orthogonal radiographs of the hip. This was measured to be 105 mm. The pin was overdrilled. Drill was removed and a 105 mm hip screw was placed. At this time, 3 to 4 mm  of compression was performed, compressing nicely across the fracture.   The hip screw was locked and the insertion guide was removed. Confirmation of nail and hip screw placement was confirmed on orthogonal  radiographs. Attention was now turned to the knee. Using a standard perfect circle technique, a distal interlocking screw was  placed through the static hole. Confirmation of placement of screw was confirmed on orthogonal radiographs.   At this time, all wounds were copiously irrigated. The wound at the hip screw seemed to have higher level of oozing than the other holes. The wound was copiously irrigated and packed. After a few minutes, oozing significantly slowed. Closure was undertaken of fascia of all 3 wounds using 0 Vicryl. Subcutaneous tissue was closed using 2-0 Vicryl. Skin was closed using staples. During the time of closure, the patient was have significant difficulty maintaining her blood pressure. Anesthesia placed a second IV line and provided a fluid bolus, transfusion of 1 unit of packed red blood cells, and placed the patient on Neo-Synephrine. The patient was having difficulty through this process. A small hematoma seemed to be building at the incision for the hip screw. Decision was made to reopen this incision to ensure that there was no active bleeding. A small hematoma was evacuated. There was no active bleeding. The wound was extended a bit to further examine the wound. Again, no active bleeding was noted. Wound was nearly dry at this point. Decision was made to place a Hemovac drain to this area. Drain was placed and closure was again undertaken. Drain was placed to Hemovac suction with very minimal output.   At this time, the patient readily stabilized. Sterile dressings were applied. The patient was transferred to a stretcher. Contralateral hip was fluoro'd to ensure proper location of the left hemiarthroplasty component. The patient was transferred to the postoperative care unit. She was stable on exiting the Operating Room. She was no longer requiring any extra fluid support or pressors. She was transferred to the CCU for overnight observation.     ____________________________ Murlean HarkShalini Alfons Sulkowski, MD sr:jm D: 07/23/2012 09:29:04 ET T: 07/23/2012 17:43:50 ET JOB#: [id number]  cc: Murlean HarkShalini Haide Klinker, MD, <Dictator> Murlean HarkSHALINI Romar Woodrick MD ELECTRONICALLY SIGNED 08/01/2012 14:17
